# Patient Record
Sex: Male | Born: 1935 | Race: Black or African American | Hispanic: No | Smoking: Never smoker
Health system: Southern US, Community
[De-identification: ages and names within clinical notes are randomized; demographics above are authoritative.]

## PROBLEM LIST (undated history)

## (undated) DIAGNOSIS — N390 Urinary tract infection, site not specified: Secondary | ICD-10-CM

## (undated) DIAGNOSIS — M1990 Unspecified osteoarthritis, unspecified site: Secondary | ICD-10-CM

## (undated) DIAGNOSIS — I10 Essential (primary) hypertension: Secondary | ICD-10-CM

## (undated) DIAGNOSIS — I2510 Atherosclerotic heart disease of native coronary artery without angina pectoris: Secondary | ICD-10-CM

## (undated) DIAGNOSIS — J40 Bronchitis, not specified as acute or chronic: Secondary | ICD-10-CM

## (undated) DIAGNOSIS — J189 Pneumonia, unspecified organism: Secondary | ICD-10-CM

## (undated) HISTORY — PX: HERNIA REPAIR: SHX51

## (undated) HISTORY — PX: CORONARY STENT PLACEMENT: SHX1402

---

## 1997-07-26 ENCOUNTER — Ambulatory Visit (HOSPITAL_COMMUNITY): Admission: RE | Admit: 1997-07-26 | Discharge: 1997-07-26 | Payer: Self-pay | Admitting: Nephrology

## 1999-02-13 ENCOUNTER — Encounter: Admission: RE | Admit: 1999-02-13 | Discharge: 1999-02-13 | Payer: Self-pay | Admitting: Nephrology

## 1999-02-13 ENCOUNTER — Encounter: Payer: Self-pay | Admitting: Nephrology

## 1999-02-18 ENCOUNTER — Encounter: Payer: Self-pay | Admitting: Urology

## 1999-02-18 ENCOUNTER — Encounter: Admission: RE | Admit: 1999-02-18 | Discharge: 1999-02-18 | Payer: Self-pay | Admitting: Urology

## 1999-02-23 ENCOUNTER — Encounter: Payer: Self-pay | Admitting: Urology

## 1999-02-23 ENCOUNTER — Encounter: Admission: RE | Admit: 1999-02-23 | Discharge: 1999-02-23 | Payer: Self-pay | Admitting: Urology

## 2001-12-21 ENCOUNTER — Encounter: Admission: RE | Admit: 2001-12-21 | Discharge: 2002-03-21 | Payer: Self-pay | Admitting: Internal Medicine

## 2004-05-06 ENCOUNTER — Emergency Department (HOSPITAL_COMMUNITY): Admission: EM | Admit: 2004-05-06 | Discharge: 2004-05-06 | Payer: Self-pay | Admitting: Emergency Medicine

## 2004-05-26 ENCOUNTER — Ambulatory Visit (HOSPITAL_COMMUNITY): Admission: RE | Admit: 2004-05-26 | Discharge: 2004-05-26 | Payer: Self-pay | Admitting: Gastroenterology

## 2004-05-26 ENCOUNTER — Encounter (INDEPENDENT_AMBULATORY_CARE_PROVIDER_SITE_OTHER): Payer: Self-pay | Admitting: Specialist

## 2005-07-19 ENCOUNTER — Encounter: Admission: RE | Admit: 2005-07-19 | Discharge: 2005-07-19 | Payer: Self-pay | Admitting: Internal Medicine

## 2008-03-05 ENCOUNTER — Emergency Department (HOSPITAL_COMMUNITY): Admission: EM | Admit: 2008-03-05 | Discharge: 2008-03-06 | Payer: Self-pay | Admitting: Emergency Medicine

## 2008-07-17 ENCOUNTER — Encounter: Admission: RE | Admit: 2008-07-17 | Discharge: 2008-07-17 | Payer: Self-pay | Admitting: Family Medicine

## 2008-07-22 ENCOUNTER — Encounter: Admission: RE | Admit: 2008-07-22 | Discharge: 2008-07-22 | Payer: Self-pay | Admitting: Family Medicine

## 2008-08-06 ENCOUNTER — Inpatient Hospital Stay (HOSPITAL_COMMUNITY): Admission: AD | Admit: 2008-08-06 | Discharge: 2008-08-08 | Payer: Self-pay | Admitting: Cardiology

## 2009-07-09 ENCOUNTER — Inpatient Hospital Stay (HOSPITAL_COMMUNITY): Admission: EM | Admit: 2009-07-09 | Discharge: 2009-07-11 | Payer: Self-pay | Admitting: Emergency Medicine

## 2010-06-15 ENCOUNTER — Inpatient Hospital Stay (INDEPENDENT_AMBULATORY_CARE_PROVIDER_SITE_OTHER)
Admission: RE | Admit: 2010-06-15 | Discharge: 2010-06-15 | Disposition: A | Discharge status: Home / Self Care | Payer: Medicare Other | Source: Ambulatory Visit | Attending: Family Medicine | Admitting: Family Medicine

## 2010-06-15 DIAGNOSIS — M129 Arthropathy, unspecified: Secondary | ICD-10-CM

## 2010-07-12 LAB — DIFFERENTIAL
Basophils Absolute: 0 10*3/uL (ref 0.0–0.1)
Basophils Relative: 0 % (ref 0–1)
Lymphocytes Relative: 22 % (ref 12–46)
Lymphs Abs: 0.9 10*3/uL (ref 0.7–4.0)
Monocytes Relative: 12 % (ref 3–12)
Neutro Abs: 2.6 10*3/uL (ref 1.7–7.7)
Neutrophils Relative %: 63 % (ref 43–77)

## 2010-07-12 LAB — CBC
HCT: 27.8 % — ABNORMAL LOW (ref 39.0–52.0)
MCHC: 32.8 g/dL (ref 30.0–36.0)
MCV: 86.8 fL (ref 78.0–100.0)
MCV: 87.2 fL (ref 78.0–100.0)
Platelets: 183 10*3/uL (ref 150–400)
RBC: 3.77 MIL/uL — ABNORMAL LOW (ref 4.22–5.81)
WBC: 4.9 10*3/uL (ref 4.0–10.5)

## 2010-07-12 LAB — TYPE AND SCREEN
Antibody Screen: POSITIVE
DAT, IgG: NEGATIVE
Donor AG Type: NEGATIVE

## 2010-07-12 LAB — HEMOGLOBIN AND HEMATOCRIT, BLOOD
HCT: 28.7 % — ABNORMAL LOW (ref 39.0–52.0)
HCT: 30.6 % — ABNORMAL LOW (ref 39.0–52.0)
HCT: 35.3 % — ABNORMAL LOW (ref 39.0–52.0)
Hemoglobin: 11.5 g/dL — ABNORMAL LOW (ref 13.0–17.0)
Hemoglobin: 9.6 g/dL — ABNORMAL LOW (ref 13.0–17.0)

## 2010-07-12 LAB — URINALYSIS, ROUTINE W REFLEX MICROSCOPIC
Glucose, UA: NEGATIVE mg/dL
Hgb urine dipstick: NEGATIVE
Leukocytes, UA: NEGATIVE
Specific Gravity, Urine: 1.025 (ref 1.005–1.030)

## 2010-07-12 LAB — HEMOCCULT GUIAC POC 1CARD (OFFICE): Fecal Occult Bld: POSITIVE

## 2010-07-12 LAB — COMPREHENSIVE METABOLIC PANEL
ALT: 15 U/L (ref 0–53)
Albumin: 3.4 g/dL — ABNORMAL LOW (ref 3.5–5.2)
Alkaline Phosphatase: 73 U/L (ref 39–117)
BUN: 15 mg/dL (ref 6–23)
GFR calc non Af Amer: 42 mL/min — ABNORMAL LOW (ref >60)
Potassium: 3.8 mEq/L (ref 3.5–5.1)
Total Protein: 7.2 g/dL (ref 6.0–8.3)

## 2010-07-12 LAB — BASIC METABOLIC PANEL
BUN: 15 mg/dL (ref 6–23)
Chloride: 106 mEq/L (ref 96–112)
Potassium: 3.8 mEq/L (ref 3.5–5.1)

## 2010-07-12 LAB — URINE CULTURE

## 2010-07-12 LAB — POCT CARDIAC MARKERS
CKMB, poc: 1.8 ng/mL (ref 1.0–8.0)
Myoglobin, poc: 177 ng/mL (ref 12–200)
Troponin i, poc: 0.11 ng/mL — ABNORMAL HIGH (ref 0.00–0.09)

## 2010-07-12 LAB — CK TOTAL AND CKMB (NOT AT ARMC)
CK, MB: 2.6 ng/mL (ref 0.3–4.0)
Relative Index: 1.5 (ref 0.0–2.5)
Total CK: 177 U/L (ref 7–232)

## 2010-07-12 LAB — CARDIAC PANEL(CRET KIN+CKTOT+MB+TROPI)
CK, MB: 2.8 ng/mL (ref 0.3–4.0)
Troponin I: 0.02 ng/mL (ref 0.00–0.06)

## 2010-07-12 LAB — TROPONIN I: Troponin I: 0.02 ng/mL (ref 0.00–0.06)

## 2010-07-12 LAB — URINE MICROSCOPIC-ADD ON

## 2010-07-29 LAB — CBC
HCT: 27.6 % — ABNORMAL LOW (ref 39.0–52.0)
Hemoglobin: 9.3 g/dL — ABNORMAL LOW (ref 13.0–17.0)
MCHC: 33.8 g/dL (ref 30.0–36.0)
MCHC: 34.4 g/dL (ref 30.0–36.0)
MCV: 84.8 fL (ref 78.0–100.0)
RBC: 3.25 MIL/uL — ABNORMAL LOW (ref 4.22–5.81)
RDW: 16.3 % — ABNORMAL HIGH (ref 11.5–15.5)

## 2010-07-29 LAB — BASIC METABOLIC PANEL
CO2: 27 mEq/L (ref 19–32)
CO2: 27 mEq/L (ref 19–32)
Calcium: 9 mg/dL (ref 8.4–10.5)
Chloride: 104 mEq/L (ref 96–112)
Creatinine, Ser: 1.53 mg/dL — ABNORMAL HIGH (ref 0.4–1.5)
GFR calc Af Amer: 46 mL/min — ABNORMAL LOW (ref >60)
Glucose, Bld: 104 mg/dL — ABNORMAL HIGH (ref 70–99)
Sodium: 138 mEq/L (ref 135–145)

## 2010-09-01 NOTE — Cardiovascular Report (Signed)
NAME:  Luke Sanchez, DESHLER NO.:  [id number]   MEDICAL RECORD NO.:  [id number]          PATIENT TYPE:  INP   LOCATION:  2501                         FACILITY:  [facility]   PHYSICIAN:  Eduardo Osier. Sharyn Lull, M.D. DATE OF BIRTH:  Apr 26, 1914   DATE OF PROCEDURE:  08/06/2008  DATE OF DISCHARGE:                            CARDIAC CATHETERIZATION   PROCEDURES:  1. Left cardiac catheterization with selective left and right coronary      angiography, left ventriculography via right groin using Judkins      technique.  2. Successful percutaneous transluminal coronary angioplasty to      proximal right coronary artery using 2.5 x 12 mm long Voyager      balloon.  3. Successful deployment of 3.0 x 23 mm long Vision Multi-Link stent      in proximal and proximal and mid junction of right coronary artery.  4. Successful postdilatation of the stent using 3.25 x 15 mm long [place]      Voyager balloon.   INDICATIONS FOR PROCEDURE:  Mr. Luke Sanchez is a 77 year old black male with  past medical history significant for hypertension, degenerative joint  disease, CA of prostate, hypercholesteremia, complains of precordial  chest pain described as heaviness off and on for last 1 month lasting 3  minutes and relieved with rest.  Denies any nausea, vomiting, or  diaphoresis.  Denies shortness of breath.  Denies palpitation,  lightheadedness, or syncope.  Denies any weakness in the arms or legs.  Denies any slurred speech.  Also, complains of claudication pain off and  on while walking.  Denies any cardiac workup in the past.  Due to  typical anginal chest pain and multiple risk factors discussed with the  patient regarding left cath, possible PTCA stenting, its risks and  benefits, i.e., death, MI, stroke, need for emergency CABG, risk of  restenosis, local vascular complications, etc., and consented for the  procedure.   PROCEDURE:  After obtaining the informed consent, the patient was  brought to the  cath lab and was placed on fluoroscopy table.  The right  groin was prepped and draped in usual fashion.  Xylocaine 2% was used  for local anesthesia in the right groin.  With the help of thin-wall  needle, 6-French arterial sheath was placed.  The sheath was aspirated  and flushed.  Next, 6-French left Judkins catheter was advanced over the  wire under fluoroscopic guidance up to the ascending aorta.  Wire was  pulled out.  The catheter was aspirated and connected to the manifold.  Catheter was further advanced and engaged into the left coronary ostium.  Multiple views of the left system were taken.  Next, the catheter was  disengaged and was pulled out over the wire and was replaced with 6-  [place] right Judkins catheter, which was advanced over the wire under  fluoroscopic guidance up to the ascending aorta.  The wire was pulled  out.  The catheter was aspirated and connected to the manifold.  Catheter was further advanced and engaged into right coronary ostium.  Multiple views of the right system were  taken.  Next, the catheter was  disengaged and was pulled out over the wire and was replaced with 6-  French pigtail catheter, which was advanced over the wire under  fluoroscopic guidance up to the ascending aorta.  Wire was pulled out.  The catheter was aspirated and connected to the manifold.  Catheter was  further advanced across aortic valve into the LV.  LV pressures were  recorded.  Next, LV graphy was done in 30-degree RAO position.  Post-  angiographic pressures were recorded from LV and then pullback pressures  were recorded from the aorta.  There was no gradient across the aortic  valve.  Next, pigtail catheter was pulled out over the wire.  Sheaths  were aspirated and flushed.   FINDINGS:  LV showed good LV systolic function, EF of 55-60%.  Left main  was short, which was patent.  LAD has 10-20% proximal stenosis.  Diagonal 1 and 2 were small, which were patent.  Ramus was  moderate  size, which was patent.  Left circumflex was patent in proximal and  midportion and has 70-75% distal smooth stenosis, distally the vessel is  small.  OM1 to OM3 were small, which were patent.  RCA has 80-85%  proximal and 60% sequential mid stenosis distally.  RCA is 100% occluded  beyond PDA.  Distal RCA was filling by collaterals from the left system.  Of note, PDA was no filling by collaterals from the left system.   INTERVENTIONAL PROCEDURE:  Successful PTCA to proximal RCA was done  using 3.5 x 12 mm long Voyager balloon for predilatation and then 3.0 x  23 mm long Vision Multi-Link venous stent was deployed in proximal and  proximal and mid junction of RCA at 13 atmospheric pressure.  Stent was  postdilated using 3.25 x 15 mm long Voyager balloon going up to 18  atmospheric pressure without evidence of dissection or distal  embolization with TIMI grade 3 distal flow in PDA.  Initially, attempted  to cross distal chronically totally total occlusion of RCA briefly, but  the wire could not be advanced, the lesion appeared to be chronically  occluded, and the patient also had collaterals from the left system,  which filled the distal RCA.  The patient tolerated procedure well.  The  patient received weight-based Angiomax and 300 mg of Plavix during the  procedure.  The patient tolerated the procedure well and was transferred  to recovery room in stable condition.      Eduardo Osier. Sharyn Lull, M.D.  Electronically Signed     MNH/MEDQ  D:  08/06/2008  T:  08/07/2008  Job:  [id number]   cc:   Cath Lab

## 2010-09-01 NOTE — Discharge Summary (Signed)
NAME:  Luke Sanchez, Luke Sanchez NO.:  [id number]   MEDICAL RECORD NO.:  [id number]          PATIENT TYPE:  INP   LOCATION:  2501                         FACILITY:  [facility]   PHYSICIAN:  Eduardo Osier. Sharyn Lull, M.D. DATE OF BIRTH:  1915/01/27   DATE OF ADMISSION:  08/06/2008  DATE OF DISCHARGE:  08/08/2008                               DISCHARGE SUMMARY   ADMITTING DIAGNOSES:  1. New-onset angina, rule out coronary insufficiency.  2. Mild aortic stenosis/sclerosis.  3. Hypertension.  4. Hypercholesteremia.  5. Degenerative joint disease.   DISCHARGE DIAGNOSES:  1. New onset angina status post percutaneous transluminal coronary      angioplasty stenting to right coronary artery.  2. Hypertension.  3. Hypercholesteremia.  4. Degenerative joint disease.  5. Aortic sclerosis.  6. Chronic anemia.   DISCHARGE HOME MEDICATIONS:  1. Enteric-coated aspirin 81 mg 1 tablet daily.  2. Plavix 75 mg 1 tablet daily with food.  3. Toprol-XL 50 mg 1 tablet daily.  4. Norvasc 5 mg 1 tablet daily.  5. Enalapril 5 mg 1 tablet daily.  6. Nitro-Dur patch 0.2 mg per hour, apply to chest wall in a.m. off at      night.  7. Kapidex 60 mg 1 daily.  8. Rapaflo 8 mg 1 daily as before.  9. Avodart 0.25 mg daily.  10.Crestor 10 mg 1 tablet daily.  11.Nitrostat 0.4 mg sublingual use as directed.  12.Tylenol 650 mg 1 tablet every 6-8 hours for musculoskeletal neck      pain as needed.  13.Feosol 325 mg 1 tablet daily.   DIET:  Low-salt, low-cholesterol.   ACTIVITY:  Increase activity slowly with assistance.  No lifting,  driving, sexual activity for 1 week.  Post PTCA and stent instructions  have been given.  Follow up with me in 1 week.   CONDITION ON DISCHARGE:  Stable.   BRIEF HISTORY AND HOSPITAL COURSE:  Mr. Heintzelman is a 77 year old black  male with past medical history significant for hypertension,  degenerative joint disease, CA of prostate, hypercholesteremia complains  of precordial  chest pain described as heaviness off and on for last 1  month lasting few minutes and relieves with rest.  Denies any nausea,  vomiting, or diaphoresis.  Denies shortness of breath.  Denies  palpitation, lightheadedness, or syncope.  Denies any weakness in arms  or legs.  Denies slurred speech.  He also complains of claudication pain  off and on with walking.  Denies any cardiac workup in the past.   PAST MEDICAL HISTORY:  As above.   PAST SURGICAL HISTORY:  He had bilateral hernia repair.   ALLERGIES:  No known drug allergies.   MEDICATIONS AT HOME:  1. Metoprolol succinate 100 mg daily.  2. Norvasc 5 mg p.o. daily.  3. Rapaflo 8 mg p.o. daily.  4. Avodart 0.5 mg p.o. daily.  5. Enalapril 5 mg p.o. daily.  6. Aspirin 81 mg p.o. daily.  7. Kapidex 60 mg p.o. daily.  8. Nitro-Dur 0.2 mg per hour daily.   SOCIAL HISTORY:  He is married, 6 children, retired, and worked  for  cigarette factory in the past.  No history of smoking or alcohol abuse.   FAMILY HISTORY:  Noncontributory.   PHYSICAL EXAMINATION:  GENERAL:  He is alert, awake, and oriented x3 in  no acute distress.  VITAL SIGNS:  Blood pressure was 130/70, pulse was 49 and regular.  HEENT:  Conjunctivae were pink.  NECK:  Supple.  No JVD.  No bruit.  LUNGS:  Clear to auscultation without rhonchi or rales.  CARDIOVASCULAR:  S1-S2 was normal.  There was 2/6 systolic ejection  murmur.  ABDOMEN:  Soft.  Bowel sounds were present and nontender.  EXTREMITIES:  There is no clubbing, cyanosis, or edema.   IMPRESSION:  1. New onset angina, rule out coronary insufficiency.  2. Aortic stenosis/sclerosis.  3. Hypertension.  4. Hypercholesteremia.  5. Anemia.  6. Degenerative joint disease.   BRIEF HOSPITAL COURSE:  The patient was a.m. admit and underwent left  cardiac cath with selective left and right coronary angioplasty and PTCA  stenting to RCA as per procedure report.  The patient tolerated the  procedure well.  There  were no complications.  Postprocedure, the  patient did not have any episodes of chest pain.  His groin remained  stable with no evidence of hematoma or bruit.  Phase I cardiac rehab was  called.  The patient has been ambulating without any problems except  some musculoskeletal neck pain.  The patient's hemoglobin yesterday  dropped from 11 to 9.3 with hematocrit of 37.6.  Repeat hemoglobin today  is 10.8 with hematocrit of 31.3.  The  patient's renal function is stable.  The patient did not have any  further chest pain during the hospital stay.  His groin was stable.  The  patient will be discharged home on above medications and will be  followed up in my office in 1 week.      Eduardo Osier. Sharyn Lull, M.D.  Electronically Signed     MNH/MEDQ  D:  08/08/2008  T:  08/08/2008  Job:  [id number]

## 2010-09-02 ENCOUNTER — Emergency Department (HOSPITAL_COMMUNITY): Payer: Medicare Other

## 2010-09-02 ENCOUNTER — Encounter (HOSPITAL_COMMUNITY): Payer: Self-pay | Admitting: Radiology

## 2010-09-02 ENCOUNTER — Emergency Department (HOSPITAL_COMMUNITY)
Admission: EM | Admit: 2010-09-02 | Discharge: 2010-09-02 | Disposition: A | Discharge status: Home / Self Care | Payer: Medicare Other | Attending: Emergency Medicine | Admitting: Emergency Medicine

## 2010-09-02 ENCOUNTER — Inpatient Hospital Stay (INDEPENDENT_AMBULATORY_CARE_PROVIDER_SITE_OTHER)
Admission: RE | Admit: 2010-09-02 | Discharge: 2010-09-02 | Disposition: A | Discharge status: Home / Self Care | Payer: Medicare Other | Source: Ambulatory Visit | Attending: Family Medicine | Admitting: Family Medicine

## 2010-09-02 DIAGNOSIS — W19XXXA Unspecified fall, initial encounter: Secondary | ICD-10-CM

## 2010-09-02 DIAGNOSIS — M25519 Pain in unspecified shoulder: Secondary | ICD-10-CM | POA: Insufficient documentation

## 2010-09-02 DIAGNOSIS — R220 Localized swelling, mass and lump, head: Secondary | ICD-10-CM | POA: Insufficient documentation

## 2010-09-02 DIAGNOSIS — R040 Epistaxis: Secondary | ICD-10-CM

## 2010-09-02 DIAGNOSIS — M25539 Pain in unspecified wrist: Secondary | ICD-10-CM | POA: Insufficient documentation

## 2010-09-02 DIAGNOSIS — M129 Arthropathy, unspecified: Secondary | ICD-10-CM | POA: Insufficient documentation

## 2010-09-02 DIAGNOSIS — IMO0002 Reserved for concepts with insufficient information to code with codable children: Secondary | ICD-10-CM | POA: Insufficient documentation

## 2010-09-02 DIAGNOSIS — S0003XA Contusion of scalp, initial encounter: Secondary | ICD-10-CM | POA: Insufficient documentation

## 2010-09-02 DIAGNOSIS — M546 Pain in thoracic spine: Secondary | ICD-10-CM | POA: Insufficient documentation

## 2010-09-02 DIAGNOSIS — I10 Essential (primary) hypertension: Secondary | ICD-10-CM | POA: Insufficient documentation

## 2010-09-02 DIAGNOSIS — S0083XA Contusion of other part of head, initial encounter: Secondary | ICD-10-CM | POA: Insufficient documentation

## 2010-09-02 DIAGNOSIS — W010XXA Fall on same level from slipping, tripping and stumbling without subsequent striking against object, initial encounter: Secondary | ICD-10-CM | POA: Insufficient documentation

## 2010-09-02 DIAGNOSIS — Z79899 Other long term (current) drug therapy: Secondary | ICD-10-CM | POA: Insufficient documentation

## 2010-09-02 DIAGNOSIS — R51 Headache: Secondary | ICD-10-CM | POA: Insufficient documentation

## 2010-09-02 DIAGNOSIS — E7800 Pure hypercholesterolemia, unspecified: Secondary | ICD-10-CM | POA: Insufficient documentation

## 2010-09-02 HISTORY — DX: Essential (primary) hypertension: I10

## 2010-09-09 ENCOUNTER — Other Ambulatory Visit: Payer: Self-pay | Admitting: Family Medicine

## 2010-09-09 DIAGNOSIS — R410 Disorientation, unspecified: Secondary | ICD-10-CM

## 2010-09-10 ENCOUNTER — Other Ambulatory Visit: Payer: Medicare Other

## 2011-01-19 LAB — URINALYSIS, ROUTINE W REFLEX MICROSCOPIC
Ketones, ur: NEGATIVE
Leukocytes, UA: NEGATIVE
Protein, ur: NEGATIVE
Urobilinogen, UA: 0.2

## 2011-01-19 LAB — POCT I-STAT, CHEM 8
BUN: 22
Chloride: 102
Creatinine, Ser: 1.8 — ABNORMAL HIGH
Sodium: 135
TCO2: 24

## 2011-01-19 LAB — URINE MICROSCOPIC-ADD ON

## 2011-03-03 ENCOUNTER — Emergency Department (HOSPITAL_COMMUNITY): Payer: Medicare Other

## 2011-03-03 ENCOUNTER — Encounter (HOSPITAL_COMMUNITY): Payer: Self-pay | Admitting: *Deleted

## 2011-03-03 ENCOUNTER — Inpatient Hospital Stay (HOSPITAL_COMMUNITY)
Admission: EM | Admit: 2011-03-03 | Discharge: 2011-03-08 | DRG: 378 | Disposition: A | Discharge status: Home / Self Care | Payer: Medicare Other | Attending: Family Medicine | Admitting: Family Medicine

## 2011-03-03 DIAGNOSIS — Z8701 Personal history of pneumonia (recurrent): Secondary | ICD-10-CM

## 2011-03-03 DIAGNOSIS — M129 Arthropathy, unspecified: Secondary | ICD-10-CM | POA: Diagnosis present

## 2011-03-03 DIAGNOSIS — Z880 Allergy status to penicillin: Secondary | ICD-10-CM

## 2011-03-03 DIAGNOSIS — K922 Gastrointestinal hemorrhage, unspecified: Secondary | ICD-10-CM

## 2011-03-03 DIAGNOSIS — I359 Nonrheumatic aortic valve disorder, unspecified: Secondary | ICD-10-CM | POA: Diagnosis present

## 2011-03-03 DIAGNOSIS — Z79899 Other long term (current) drug therapy: Secondary | ICD-10-CM

## 2011-03-03 DIAGNOSIS — K5792 Diverticulitis of intestine, part unspecified, without perforation or abscess without bleeding: Secondary | ICD-10-CM

## 2011-03-03 DIAGNOSIS — Z9861 Coronary angioplasty status: Secondary | ICD-10-CM

## 2011-03-03 DIAGNOSIS — D649 Anemia, unspecified: Secondary | ICD-10-CM

## 2011-03-03 DIAGNOSIS — I350 Nonrheumatic aortic (valve) stenosis: Secondary | ICD-10-CM

## 2011-03-03 DIAGNOSIS — R1032 Left lower quadrant pain: Secondary | ICD-10-CM

## 2011-03-03 DIAGNOSIS — K5733 Diverticulitis of large intestine without perforation or abscess with bleeding: Secondary | ICD-10-CM

## 2011-03-03 DIAGNOSIS — I2510 Atherosclerotic heart disease of native coronary artery without angina pectoris: Secondary | ICD-10-CM

## 2011-03-03 DIAGNOSIS — I10 Essential (primary) hypertension: Secondary | ICD-10-CM | POA: Diagnosis present

## 2011-03-03 DIAGNOSIS — N400 Enlarged prostate without lower urinary tract symptoms: Secondary | ICD-10-CM | POA: Diagnosis present

## 2011-03-03 DIAGNOSIS — D62 Acute posthemorrhagic anemia: Secondary | ICD-10-CM

## 2011-03-03 HISTORY — DX: Pneumonia, unspecified organism: J18.9

## 2011-03-03 HISTORY — DX: Unspecified osteoarthritis, unspecified site: M19.90

## 2011-03-03 HISTORY — DX: Bronchitis, not specified as acute or chronic: J40

## 2011-03-03 HISTORY — DX: Atherosclerotic heart disease of native coronary artery without angina pectoris: I25.10

## 2011-03-03 LAB — DIFFERENTIAL
Basophils Absolute: 0 10*3/uL (ref 0.0–0.1)
Eosinophils Relative: 0 % (ref 0–5)
Lymphocytes Relative: 18 % (ref 12–46)
Monocytes Relative: 12 % (ref 3–12)
Neutrophils Relative %: 70 % (ref 43–77)

## 2011-03-03 LAB — CBC
MCV: 89.4 fL (ref 78.0–100.0)
Platelets: 151 10*3/uL (ref 150–400)
RBC: 2.35 MIL/uL — ABNORMAL LOW (ref 4.22–5.81)
RDW: 15.9 % — ABNORMAL HIGH (ref 11.5–15.5)
WBC: 5.8 10*3/uL (ref 4.0–10.5)

## 2011-03-03 LAB — BASIC METABOLIC PANEL
CO2: 31 mEq/L (ref 19–32)
Chloride: 100 mEq/L (ref 96–112)
Creatinine, Ser: 1.49 mg/dL — ABNORMAL HIGH (ref 0.50–1.35)
GFR calc Af Amer: 44 mL/min — ABNORMAL LOW (ref >90)
Potassium: 4 mEq/L (ref 3.5–5.1)
Sodium: 139 mEq/L (ref 135–145)

## 2011-03-03 LAB — PREPARE RBC (CROSSMATCH)

## 2011-03-03 MED ORDER — PANTOPRAZOLE SODIUM 40 MG IV SOLR
INTRAVENOUS | Status: AC
  Filled 2011-03-03: qty 40

## 2011-03-03 MED ORDER — MORPHINE SULFATE 4 MG/ML IJ SOLN
4.0000 mg | Freq: Once | INTRAMUSCULAR | Status: AC
  Administered 2011-03-03: 4 mg via INTRAVENOUS

## 2011-03-03 MED ORDER — CIPROFLOXACIN IN D5W 400 MG/200ML IV SOLN
400.0000 mg | Freq: Two times a day (BID) | INTRAVENOUS | Status: DC
  Administered 2011-03-04 – 2011-03-07 (×7): 400 mg via INTRAVENOUS
  Filled 2011-03-03 (×10): qty 200

## 2011-03-03 MED ORDER — ONDANSETRON HCL 4 MG/2ML IJ SOLN
4.0000 mg | Freq: Once | INTRAMUSCULAR | Status: AC
  Administered 2011-03-03: 4 mg via INTRAVENOUS

## 2011-03-03 MED ORDER — MORPHINE SULFATE 2 MG/ML IJ SOLN
1.0000 mg | INTRAMUSCULAR | Status: DC | PRN
  Administered 2011-03-03 – 2011-03-04 (×2): 1 mg via INTRAVENOUS
  Filled 2011-03-03 (×2): qty 1

## 2011-03-03 MED ORDER — MORPHINE SULFATE 4 MG/ML IJ SOLN
INTRAMUSCULAR | Status: AC
  Filled 2011-03-03: qty 1

## 2011-03-03 MED ORDER — ONDANSETRON HCL 4 MG/2ML IJ SOLN: 4.0000 mg | Freq: Four times a day (QID) | INTRAMUSCULAR | Status: DC | PRN

## 2011-03-03 MED ORDER — METRONIDAZOLE IN NACL 5-0.79 MG/ML-% IV SOLN
500.0000 mg | Freq: Once | INTRAVENOUS | Status: AC
  Administered 2011-03-03: 500 mg via INTRAVENOUS
  Filled 2011-03-03: qty 100

## 2011-03-03 MED ORDER — CIPROFLOXACIN IN D5W 400 MG/200ML IV SOLN
400.0000 mg | Freq: Once | INTRAVENOUS | Status: AC
  Administered 2011-03-03: 400 mg via INTRAVENOUS
  Filled 2011-03-03: qty 200

## 2011-03-03 MED ORDER — METRONIDAZOLE IN NACL 5-0.79 MG/ML-% IV SOLN
500.0000 mg | Freq: Three times a day (TID) | INTRAVENOUS | Status: DC
  Administered 2011-03-04 – 2011-03-07 (×11): 500 mg via INTRAVENOUS
  Filled 2011-03-03 (×15): qty 100

## 2011-03-03 MED ORDER — PANTOPRAZOLE SODIUM 40 MG IV SOLR
40.0000 mg | Freq: Once | INTRAVENOUS | Status: AC
  Administered 2011-03-03: 40 mg via INTRAVENOUS

## 2011-03-03 MED ORDER — ONDANSETRON HCL 4 MG/2ML IJ SOLN
INTRAMUSCULAR | Status: AC
  Filled 2011-03-03: qty 2

## 2011-03-03 MED ORDER — ONDANSETRON HCL 4 MG PO TABS: 4.0000 mg | ORAL_TABLET | Freq: Four times a day (QID) | ORAL | Status: DC | PRN

## 2011-03-03 MED ORDER — IOHEXOL 300 MG/ML  SOLN
80.0000 mL | Freq: Once | INTRAMUSCULAR | Status: AC | PRN
  Administered 2011-03-03: 80 mL via INTRAVENOUS

## 2011-03-03 MED ORDER — SODIUM CHLORIDE 0.9 % IV SOLN
INTRAVENOUS | Status: DC
  Administered 2011-03-03: 1000 mL via INTRAVENOUS

## 2011-03-03 MED ORDER — SODIUM CHLORIDE 0.9 % IV SOLN: INTRAVENOUS | Status: AC

## 2011-03-03 NOTE — ED Notes (Signed)
Pts daughter reports pt passed dark stools this am. Clots present. Pt reports abd pain this am, none currently. Hx of diverticulosis.

## 2011-03-03 NOTE — ED Notes (Signed)
Informed Dr. Nino Parsley of abnormal lab of hgb 6.7

## 2011-03-03 NOTE — ED Notes (Signed)
MD at bedside. 

## 2011-03-03 NOTE — ED Notes (Signed)
Pt doing okay. Just got back to bed from bathroom where he had bloody stool. Introduced myself to patient. Awaiting blood from blood bank. And Md to reassess

## 2011-03-03 NOTE — ED Notes (Signed)
Called to restroom due to patient bleeding. Passing large blood clots. Dr Nino Parsley aware. Assisted back to his room. No complaints at present.

## 2011-03-03 NOTE — ED Notes (Signed)
Pt and daughter states that bloody stools started this am. States that he had diverticulitis in March which he had bloody stools. Escorted pt to restroom and he did have bright red liquid bloody stools

## 2011-03-03 NOTE — H&P (Signed)
PCP:   dr hill   Chief Complaint:  Luke Sanchez, abd pain  HPI: 77 yo highly functioning male lives at home with his wife of 28 years marriage comes in with dtr due to several days of worsening llq pain and then started this am with brbpr.  He has also had several episodes of vomiting and not eating well.  He has had diverticular bleed before earlier this year and this will be his 2nd hospitalization this year for same issue.  He is still have some bleeding red per rectum but not as much.  Denies fevers at home.  Mentation has been normal.  Review of Systems:  O/w neg  Past Medical History: Past Medical History  Diagnosis Date  . Hypertension    Past Surgical History  Procedure Date  . Coronary stent placement     Medications: Prior to Admission medications   Medication Sig Start Date End Date Taking? Authorizing Provider  ARTIFICIAL TEAR OP Apply 1 drop to eye daily as needed. For dry eyes    Yes Historical Provider, MD  Dexlansoprazole (DEXILANT) 30 MG capsule Take 60 mg by mouth daily.    Yes Historical Provider, MD  ferrous sulfate 325 (65 FE) MG tablet Take 325 mg by mouth daily with breakfast.     Yes Historical Provider, MD  finasteride (PROSCAR) 5 MG tablet Take 5 mg by mouth daily.    Yes Historical Provider, MD  metoprolol (TOPROL-XL) 50 MG 24 hr tablet Take 50 mg by mouth daily.     Yes Historical Provider, MD  olmesartan (BENICAR) 40 MG tablet Take 20 mg by mouth daily.    Yes Historical Provider, MD  silodosin (RAPAFLO) 4 MG CAPS capsule Take 8 mg by mouth daily with breakfast.    Yes Historical Provider, MD    Allergies:   Allergies  Allergen Reactions  . Penicillins Anaphylaxis    Social History:  reports that he has never smoked. He does not have any smokeless tobacco history on file. He reports that he does not drink alcohol or use illicit drugs.  Family History: No family history on file.  Physical Exam: Filed Vitals:   03/03/11 1518 03/03/11 1923  BP:  159/48 136/52  Pulse: 63 65  Temp:  98.7 F (37.1 C)  TempSrc:  Oral  Resp: 16 16  SpO2: 99% 95%   General appearance: alert, cooperative and no distress Resp: clear to auscultation bilaterally Cardio: regular rate and rhythm, S1, S2 normal, no murmur, click, rub or gallop GI: abnormal findings:  soft nd, ttp llq pos bs no r/g nonacute abd Extremities: extremities normal, atraumatic, no cyanosis or edema Pulses: 2+ and symmetric Skin: Skin color, texture, turgor normal. No rashes or lesions Neurologic: Grossly normal   Labs on Admission:   [facility] 03/03/11 1745  NA 139  K 4.0  CL 100  CO2 31  GLUCOSE 124*  BUN 26*  CREATININE 1.49*  CALCIUM 8.7  MG --  PHOS --    Basename 03/03/11 1745  WBC 5.8  NEUTROABS 4.1  HGB 6.7*  HCT 21.0*  MCV 89.4  PLT 151    Radiological Exams on Admission: Ct Abdomen Pelvis W Contrast  03/03/2011  *RADIOLOGY REPORT*  Clinical Data: Left lower quadrant pain.  History diverticulitis.  CT ABDOMEN AND PELVIS WITH CONTRAST  Technique:  Multidetector CT imaging of the abdomen and pelvis was performed following the standard protocol during bolus administration of intravenous contrast.  Contrast:  75 ml Omnipaque 300  Comparison: CT abdomen and pelvis 07/09/2009.  Findings: The heart size is normal.  Extensive coronary artery calcifications are again noted.  No significant pleural or pericardial effusions are present.  Mild intra and extrahepatic biliary dilation is present.  The common bile duct measures up to 9.5 mm.  This may be within normal limits for age.  The gallbladder is normal.  Minimal calcifications within the left lobe of the liver are stable.  The stomach, duodenum, and pancreas are within normal limits.  Spleen is unremarkable.  Cystic changes of both kidneys are stable.  Atherosclerotic calcifications are present in the abdominal aorta without aneurysm. There is borderline aneurysmal dilation of the iliac arteries.  Extensive  diverticular changes are noted within the sigmoid colon. There is focal inflammation at the junction of the descending and sigmoid colon, compatible with diverticulitis.  No significant free fluid or free air is evident.  The more proximal colon demonstrates diverticular change without other signs inflammation.  The appendix is within normal limits.  Small bowel is unremarkable.  The urinary bladder and prostate gland are within normal limits for age.  No significant adenopathy or free fluid is present.  Degenerative anterolisthesis at L3-4 is stable.  Multilevel spondylosis is otherwise unchanged.  No focal lytic or blastic lesions are evident.  IMPRESSION:  1.  Sigmoid diverticulitis without evidence for abscess or free air. 2.  Extensive diverticular changes distal and proximal to the side of diverticulitis without a second area of acute inflammation. 3.  Atherosclerosis with borderline aneurysmal dilation of the proximal iliac arteries.  Coronary artery disease is evident. 4.  Mild intra and extrahepatic biliary dilation may be within normal limits for age. 5.  Stable spondylosis of the lumbar spine.  Original Report Authenticated By: Jamesetta Orleans. MATTERN, M.D.    Assessment/Plan Present on Admission:  77 yo male with acute diverticulitis with acute blood loss anemia due to same  .Aortic valve stenosis  stable .CAD S/P percutaneous coronary angioplasty hold anticoagulants .GIB (gastrointestinal bleeding)  Transfuse 2 units prbc, serial h/h q 6hr .Abdominal pain, left lower quadrant .Acute blood loss anemia .Diverticulitis of colon with bleeding  Iv cipro/flagyl.  No absess on ct scan.  Place in stepdown overnight at least pt HD stable but due to advanced age high risk for deterioration. Should respond to conservative treatment.  DAVID,RACHAL A 03/03/2011, 8:50 PM

## 2011-03-03 NOTE — ED Provider Notes (Addendum)
History     CSN: [id number] Arrival date & time: 03/03/2011  3:14 PM   First MD Initiated Contact with Patient 03/03/11 1643      Chief Complaint  Patient presents with  . Rectal Bleeding    (Consider location/radiation/quality/duration/timing/severity/associated sxs/prior treatment) Patient is a 77 y.o. male presenting with hematochezia. The history is provided by the patient and a relative.  Rectal Bleeding  Associated symptoms include abdominal pain, diarrhea, nausea and vomiting. Pertinent negatives include no fever, no hematuria, no chest pain, no headaches and no coughing.   the patient is a 77 year old male, with a history of diverticulosis, who presents to the emergency department with left lower abdominal pain, and bloody stool.  He has had diarrhea.  He had nausea and vomiting.  Last night.  He denies fevers, chills, shortness of breath, lightheadedness, or dizziness.  He is not taking any anticoagulants.  His daughter called his gastroenterologist, who told him to come to the emergency department for further evaluation.  Past Medical History  Diagnosis Date  . Hypertension     Past Surgical History  Procedure Date  . Coronary stent placement     No family history on file.  History  Substance Use Topics  . Smoking status: Never Smoker   . Smokeless tobacco: Not on file  . Alcohol Use: No      Review of Systems  Constitutional: Negative for fever, chills and diaphoresis.  HENT: Negative for nosebleeds and congestion.   Eyes: Negative for redness.  Respiratory: Negative for cough, chest tightness and shortness of breath.   Cardiovascular: Negative for chest pain and palpitations.  Gastrointestinal: Positive for nausea, vomiting, abdominal pain, diarrhea, blood in stool and hematochezia.  Genitourinary: Negative for hematuria.  Musculoskeletal: Negative for back pain.  Neurological: Negative for headaches.  Psychiatric/Behavioral: Negative for confusion.     Allergies  Penicillins  Home Medications   Current Outpatient Rx  Name Route Sig Dispense Refill  . ARTIFICIAL TEAR OP Ophthalmic Apply 1 drop to eye daily as needed. For dry eyes     . DEXLANSOPRAZOLE 30 MG PO CPDR Oral Take 60 mg by mouth daily.     Marland Kitchen FERROUS SULFATE 325 (65 FE) MG PO TABS Oral Take 325 mg by mouth daily with breakfast.      . FINASTERIDE 5 MG PO TABS Oral Take 5 mg by mouth daily.     Marland Kitchen METOPROLOL SUCCINATE 50 MG PO TB24 Oral Take 50 mg by mouth daily.      Marland Kitchen OLMESARTAN MEDOXOMIL 40 MG PO TABS Oral Take 20 mg by mouth daily.     Marland Kitchen SILODOSIN 4 MG PO CAPS Oral Take 8 mg by mouth daily with breakfast.       BP 159/48  Pulse 63  Resp 16  SpO2 99%  Physical Exam  Constitutional: He is oriented to person, place, and time. He appears well-developed and well-nourished. No distress.  HENT:  Head: Normocephalic and atraumatic.  Eyes: EOM are normal. Pupils are equal, round, and reactive to light.  Neck: Normal range of motion. Neck supple.  Cardiovascular: Normal rate and regular rhythm.   Murmur heard. Pulmonary/Chest: Effort normal and breath sounds normal. No respiratory distress. He has no wheezes. He has no rales.  Abdominal: Soft. Bowel sounds are normal. He exhibits no distension and no mass. There is tenderness. There is no rebound and no guarding.       Moderate tenderness in left lower quadrant and left upper quadrant  Musculoskeletal: Normal range of motion. He exhibits no edema and no tenderness.  Neurological: He is alert and oriented to person, place, and time. No cranial nerve deficit.  Skin: Skin is warm and dry. He is not diaphoretic.  Psychiatric: He has a normal mood and affect. His behavior is normal.    ED Course  Procedures (including critical care time)  77 year old male, with a history of diverticulosis presents with abdominal pain, and bloody stool.  He got tenderness in the left lower quadrant.  On examination.  We will establish an  IV perform laboratory testing, and a CAT scan of his abdomen and treat his symptoms.   Labs Reviewed  CBC  DIFFERENTIAL  BASIC METABOLIC PANEL   Explained findings to pt and daughter.  Dr. Mirna Mires is pcp.  Dr. Randa Evens is GI MD.  8:26 PM Spoke with Dr. Onalee Hua.  She will admit to step down.  Team 2  CRITICAL CARE Performed by: Nicholes Stairs   Total critical care time: 30 min  Critical care time was exclusive of separately billable procedures and treating other patients.  Critical care was necessary to treat or prevent imminent or life-threatening deterioration.  Critical care was time spent personally by me on the following activities: development of treatment plan with patient and/or surrogate as well as nursing, discussions with consultants, evaluation of patient's response to treatment, examination of patient, obtaining history from patient or surrogate, ordering and performing treatments and interventions, ordering and review of laboratory studies, ordering and review of radiographic studies, pulse oximetry and re-evaluation of patient's condition.   MDM  Diverticulitis- requiring iv abxs GI bleed causing anemia which required transfusion Hemodynamically stable.  No hypotension or tachycardia        Nicholes Stairs, MD 03/03/11 2008  Nicholes Stairs, MD 03/03/11 2028

## 2011-03-03 NOTE — ED Notes (Signed)
Patient has ambulated to restroom x3 with assistance results bright red liquid stools

## 2011-03-04 ENCOUNTER — Encounter (HOSPITAL_COMMUNITY): Payer: Self-pay | Admitting: Family Medicine

## 2011-03-04 LAB — CBC
Hemoglobin: 7.7 g/dL — ABNORMAL LOW (ref 13.0–17.0)
MCHC: 32.1 g/dL (ref 30.0–36.0)
WBC: 4.2 10*3/uL (ref 4.0–10.5)

## 2011-03-04 LAB — COMPREHENSIVE METABOLIC PANEL
ALT: 7 U/L (ref 0–53)
BUN: 24 mg/dL — ABNORMAL HIGH (ref 6–23)
CO2: 30 mEq/L (ref 19–32)
Calcium: 8.2 mg/dL — ABNORMAL LOW (ref 8.4–10.5)
Creatinine, Ser: 1.52 mg/dL — ABNORMAL HIGH (ref 0.50–1.35)
GFR calc Af Amer: 43 mL/min — ABNORMAL LOW (ref >90)
GFR calc non Af Amer: 37 mL/min — ABNORMAL LOW (ref >90)
Glucose, Bld: 92 mg/dL (ref 70–99)
Sodium: 137 mEq/L (ref 135–145)

## 2011-03-04 LAB — PROTIME-INR
INR: 1.27 (ref 0.00–1.49)
Prothrombin Time: 16.2 seconds — ABNORMAL HIGH (ref 11.6–15.2)

## 2011-03-04 LAB — HEMOGLOBIN AND HEMATOCRIT, BLOOD: Hemoglobin: 7.1 g/dL — ABNORMAL LOW (ref 13.0–17.0)

## 2011-03-04 LAB — APTT: aPTT: 40 seconds — ABNORMAL HIGH (ref 24–37)

## 2011-03-04 MED ORDER — ARTIFICIAL TEARS OP OINT
TOPICAL_OINTMENT | OPHTHALMIC | Status: DC | PRN
  Administered 2011-03-04: 19:00:00 via OPHTHALMIC
  Filled 2011-03-04: qty 3.5

## 2011-03-04 MED ORDER — DIPHENHYDRAMINE HCL 50 MG/ML IJ SOLN
6.2500 mg | Freq: Once | INTRAMUSCULAR | Status: AC
  Administered 2011-03-04: 6.5 mg via INTRAVENOUS
  Filled 2011-03-04: qty 1

## 2011-03-04 MED ORDER — FUROSEMIDE 10 MG/ML IJ SOLN: 20.0000 mg | Freq: Once | INTRAMUSCULAR | Status: DC

## 2011-03-04 MED ORDER — ACETAMINOPHEN 325 MG PO TABS
650.0000 mg | ORAL_TABLET | Freq: Once | ORAL | Status: AC
  Administered 2011-03-04: 650 mg via ORAL
  Filled 2011-03-04: qty 2

## 2011-03-04 NOTE — Progress Notes (Signed)
UR completed 

## 2011-03-04 NOTE — ED Notes (Signed)
Pt family member wishes to be contacted when pt gets a room. Contact name and number: [employer] [phone] (pt daughter). Call anytime with room number,contact nurse  And phone number.

## 2011-03-04 NOTE — ED Notes (Signed)
Second unit infusing. Pt tolerating well

## 2011-03-04 NOTE — Progress Notes (Signed)
[facility]ITAL[facility] progress note    Subjective: Complete history and physical reviewed with patient at bedside.  Patient states that he had about 7 episodes of dark stool mixed with blood. He states the blood was bright red at times and dark red and started yesterday. He states that the stool started first and then started having cramping abdominal pain in his left lower quadrant. It was sort of a "twisting" pain. He states that he has no abdominal pain currently.  He has no nausea no vomiting no chest pain no blurred vision no double vision. He does not get dizzy when he stands.     Objective: Vital signs in last 24 hours: Temp:  [97.7 F (36.5 C)-98.8 F (37.1 C)] 98.6 F (37 C) (11/15 0507) Pulse Rate:  [58-104] 86  (11/15 0507) Resp:  [16-22] 20  (11/15 0507) BP: (129-175)/(48-86) 175/75 mmHg (11/15 0507) SpO2:  [93 %-99 %] 93 % (11/15 0507) Weight:  [63.3 kg (139 lb 8.8 oz)] 139 lb 8.8 oz (63.3 kg) (11/15 0507) Weight change:   Intake/Output Summary (Last 24 hours) at 03/04/11 0811 Last data filed at 03/04/11 0600  Gross per 24 hour  Intake    709 ml  Output    400 ml  Net    309 ml    General appearance: alert, cooperative, appears stated age and no distress Head: Normocephalic, without obvious abnormality, atraumatic Neck: no adenopathy, no carotid bruit, no JVD, supple, symmetrical, trachea midline and thyroid not enlarged, symmetric, no tenderness/mass/nodules Back: symmetric, no curvature. ROM normal. No CVA tenderness. Lungs: clear to auscultation bilaterally and normal percussion bilaterally Abdomen: abnormal findings:  guarding and moderate tenderness in the LLQ Neurologic: Alert and oriented X 3, normal strength and tone. Normal symmetric reflexes. Normal coordination and gait  Lab Results:  [facility] 03/04/11 0405 03/03/11 1745  NA 137 139  K 4.1 4.0  CL 101 100  CO2 30 31  GLUCOSE 92 124*  BUN 24* 26*  CREATININE 1.52* 1.49*  CALCIUM 8.2* 8.7    MG -- --  PHOS -- --    Basename 03/04/11 0405  AST 17  ALT 7  ALKPHOS 52  BILITOT 0.7  PROT 5.8*  ALBUMIN 2.7*   No results found for this basename: LIPASE:2,AMYLASE:2 in the last 72 hours  Basename 03/04/11 0405 03/03/11 1745  WBC 4.2 5.8  NEUTROABS -- 4.1  HGB 7.7* 6.7*  HCT 24.0* 21.0*  MCV 90.2 89.4  PLT 116* 151   No results found for this basename: CKTOTAL:3,CKMB:3,CKMBINDEX:3,TROPONINI:3 in the last 72 hours  Micro Results: Recent Results (from the past 240 hour(s))  MRSA PCR SCREENING     Status: Normal   Collection Time   03/04/11  4:33 AM      Component Value Range Status Comment   MRSA by PCR NEGATIVE  NEGATIVE  Final    Studies/Results: Ct Abdomen Pelvis W Contrast  03/03/2011  *RADIOLOGY REPORT*  Clinical Data: Left lower quadrant pain.  History diverticulitis.  CT ABDOMEN AND PELVIS WITH CONTRAST  Technique:  Multidetector CT imaging of the abdomen and pelvis was performed following the standard protocol during bolus administration of intravenous contrast.  Contrast:  75 ml Omnipaque 300  Comparison: CT abdomen and pelvis 07/09/2009.  Findings: The heart size is normal.  Extensive coronary artery calcifications are again noted.  No significant pleural or pericardial effusions are present.  Mild intra and extrahepatic biliary dilation is present.  The common bile duct measures up to 9.5 mm.  This may be within normal limits for age.  The gallbladder is normal.  Minimal calcifications within the left lobe of the liver are stable.  The stomach, duodenum, and pancreas are within normal limits.  Spleen is unremarkable.  Cystic changes of both kidneys are stable.  Atherosclerotic calcifications are present in the abdominal aorta without aneurysm. There is borderline aneurysmal dilation of the iliac arteries.  Extensive diverticular changes are noted within the sigmoid colon. There is focal inflammation at the junction of the descending and sigmoid colon, compatible with  diverticulitis.  No significant free fluid or free air is evident.  The more proximal colon demonstrates diverticular change without other signs inflammation.  The appendix is within normal limits.  Small bowel is unremarkable.  The urinary bladder and prostate gland are within normal limits for age.  No significant adenopathy or free fluid is present.  Degenerative anterolisthesis at L3-4 is stable.  Multilevel spondylosis is otherwise unchanged.  No focal lytic or blastic lesions are evident.  IMPRESSION:  1.  Sigmoid diverticulitis without evidence for abscess or free air. 2.  Extensive diverticular changes distal and proximal to the side of diverticulitis without a second area of acute inflammation. 3.  Atherosclerosis with borderline aneurysmal dilation of the proximal iliac arteries.  Coronary artery disease is evident. 4.  Mild intra and extrahepatic biliary dilation may be within normal limits for age. 5.  Stable spondylosis of the lumbar spine.  Original Report Authenticated By: Jamesetta Orleans. MATTERN, M.D.   Medications: I have reviewed the patient's current medications. Scheduled Meds:   . ciprofloxacin  400 mg Intravenous Once  . ciprofloxacin  400 mg Intravenous Q12H  . metronidazole  500 mg Intravenous Once  . metronidazole  500 mg Intravenous Q8H  .  morphine injection  4 mg Intravenous Once  . ondansetron  4 mg Intravenous Once  . pantoprazole (PROTONIX) IV  40 mg Intravenous Once   Continuous Infusions:   . sodium chloride    . DISCONTD: sodium chloride 1,000 mL (03/03/11 1744)   PRN Meds:.iohexol, morphine, ondansetron (ZOFRAN) IV, ondansetron Medications Discontinued During This Encounter  Medication Reason  . metoprolol (LOPRESSOR) 50 MG tablet Duplicate  . 0.9 %  sodium chloride infusion    Assessment/Plan: Patient Active Hospital Problem List: GIB (gastrointestinal bleeding) (03/03/2011)   Assessment:    Plan: Patient's GI bleed is likely secondary to diverticulitis.  Patient hasn't followed by Dr. Carman Ching gastroenterology in the past. Patient has a history of stent placement in 2010 as well but obviously his bleeding currently will preclude the use of any type of antiplatelet therapy. CBC a.m. D2 ciprofloxacin and Flagyl/ duration 14 days Bowel rest-n.p.o. today versus graduate clear liquids this afternoon  Aortic valve stenosis (03/03/2011)   Assessment: Stable. This is considered mild per cardiac catheterization done in 2010.   CAD S/P percutaneous coronary angioplasty (03/03/2011)   Assessment: Stable. Patient is to be on Plavix as per above discussion. Outpatient discussion with cardiology and gastroenterology.     Abdominal pain, left lower quadrant (03/03/2011)   Assessment: Secondary to #1     Acute blood loss anemia (03/03/2011)   Assessment: Status post 2 units packed red blood cell transfusion. Will get another data point tomorrow to review.     Diverticulitis of colon with bleeding (03/03/2011)   Assessment: Stable. See above     History of prostatic cancer versus benign prostatic hypertrophy-stable   LOS: 1 day   Jovanni Rash,JAI 03/04/2011, 8:11 AM

## 2011-03-04 NOTE — Consult Note (Signed)
Angiographic data from 2010 cath FINDINGS:  LV showed good LV systolic function, EF of 55-60%.  Left main   was short, which was patent.  LAD has 10-20% proximal stenosis.   Diagonal 1 and 2 were small, which were patent.  Ramus was moderate   size, which was patent.  Left circumflex was patent in proximal and   midportion and has 70-75% distal smooth stenosis, distally the vessel is   small.  OM1 to OM3 were small, which were patent.  RCA has 80-85%   proximal and 60% sequential mid stenosis distally.  RCA is 100% occluded   beyond PDA.  Distal RCA was filling by collaterals from the left system.   Of note, PDA was no filling by collaterals from the left system.

## 2011-03-04 NOTE — ED Notes (Signed)
Pt asleep. Tolerated transfusion well. No issues at this time. Pt has room assignment. Will attempt to call report

## 2011-03-04 NOTE — ED Notes (Signed)
Report called to carrie, RN 2 west. All questions/concerns answered

## 2011-03-04 NOTE — ED Notes (Signed)
Phlebotomy in with pt drawing morning labs. Will transfer pt to 2 west after labs

## 2011-03-05 LAB — CBC
HCT: 25.4 % — ABNORMAL LOW (ref 39.0–52.0)
Hemoglobin: 8.6 g/dL — ABNORMAL LOW (ref 13.0–17.0)
RDW: 15.1 % (ref 11.5–15.5)
WBC: 4 10*3/uL (ref 4.0–10.5)

## 2011-03-05 LAB — HEMOGLOBIN AND HEMATOCRIT, BLOOD: Hemoglobin: 8.7 g/dL — ABNORMAL LOW (ref 13.0–17.0)

## 2011-03-05 MED ORDER — LABETALOL HCL 5 MG/ML IV SOLN
5.0000 mg | INTRAVENOUS | Status: DC | PRN
  Administered 2011-03-05 – 2011-03-06 (×5): 5 mg via INTRAVENOUS
  Filled 2011-03-05 (×3): qty 4

## 2011-03-05 NOTE — Progress Notes (Signed)
Physical Therapy Evaluation Patient Details Name: Luke Sanchez MRN: [id number] DOB: 06-21-1914 Today's Date: 03/05/2011 [id number] Darene Lamer  Problem List:  Patient Active Problem List  Diagnoses  . Aortic valve stenosis  . CAD S/P percutaneous coronary angioplasty  . GIB (gastrointestinal bleeding)  . Abdominal pain, left lower quadrant  . Acute blood loss anemia  . Diverticulitis of colon with bleeding    Past Medical History:  Past Medical History  Diagnosis Date  . Hypertension   . Bronchitis   . Arthritis   . Pneumonia     hx of  . CAD (coronary artery disease)     stent RCA 2010-[name]   Past Surgical History:  Past Surgical History  Procedure Date  . Coronary stent placement   . Hernia repair     x 2    PT Assessment/Plan/Recommendation PT Assessment Clinical Impression Statement: Patient incontinent of stool with GI bleed so placed mesh briefs and pad on patient.  Pt. admitted with GI bleed presents with decreased strength, balance, activity tolerance and will benefit from skilled PT in the acute setting to allow D/C home with wife and potentially out side help hired by family and HHPT. PT Recommendation/Assessment: Patient will need skilled PT in the acute care venue PT Problem List: Decreased knowledge of use of DME;Decreased activity tolerance;Decreased balance;Decreased mobility Barriers to Discharge: Decreased caregiver support PT Therapy Diagnosis : Abnormality of gait;Generalized weakness PT Plan PT Frequency: Min 3X/week PT Treatment/Interventions: Gait training;Patient/family education;DME instruction;Functional mobility training;Therapeutic exercise;Balance training PT Recommendation Follow Up Recommendations: Home health PT Equipment Recommended: Rolling walker with 5" wheels (states the one he has is too short) PT Goals  Acute Rehab PT Goals PT Goal Formulation: With patient Time For Goal Achievement: 7 days Pt will Transfer Sit to Stand/Stand  to Sit: with modified independence PT Transfer Goal: Sit to Stand/Stand to Sit - Progress: Progressing toward goal Pt will Stand: with modified independence;3 - 5 min;with unilateral upper extremity support (while performing simple functional task without LOB) PT Goal: Stand - Progress: Progressing toward goal Pt will Ambulate: >150 feet;with modified independence;with least restrictive assistive device PT Goal: Ambulate - Progress: Progressing toward goal  PT Evaluation Precautions/Restrictions    Prior Functioning  Home Living Lives With: Spouse Type of Home: House Home Layout: One level Home Access: Level entry Bathroom Shower/Tub: Engineer, manufacturing systems: Standard (with sink beside) Home Adaptive Equipment: Walker - rolling;Straight cane (walker too short per patient) Prior Function Level of Independence: Independent with gait;Independent with homemaking with ambulation;Independent with basic ADLs Cognition Cognition Arousal/Alertness: Awake/alert Overall Cognitive Status: Appears within functional limits for tasks assessed Sensation/Coordination Sensation Light Touch: Impaired Detail Light Touch Impaired Details: Impaired LLE;Impaired RLE Extremity Assessment RLE Assessment RLE Assessment: Within Functional Limits LLE Assessment LLE Assessment: Within Functional Limits Mobility (including Balance) Bed Mobility Rolling Right: 6: Modified independent (Device/Increase time);With rail Rolling Left: With rail;6: Modified independent (Device/Increase time) Supine to Sit: 6: Modified independent (Device/Increase time);HOB flat Sit to Supine - Right: 6: Modified independent (Device/Increase time);HOB flat Transfers Sit to Stand: 4: Min assist Sit to Stand Details (indicate cue type and reason): pulls up on rolling walker Stand to Sit: 4: Min assist Stand to Sit Details: for safety to ensure controlled descent Ambulation/Gait Ambulation/Gait Assistance: 5:  Supervision Ambulation/Gait Assistance Details (indicate cue type and reason): for safety (did complain of increased knee pain with ambulation ("10/10") but reports he's accustomed to it and not increased over usual amount of pain with ambulation Ambulation Distance (  Feet): 200 Feet Assistive device: Rolling walker  Posture/Postural Control Posture/Postural Control: No significant limitations Exercise    End of Session PT - End of Session Equipment Utilized During Treatment: Gait belt Activity Tolerance: Patient tolerated treatment well Patient left: in bed General Behavior During Session: [facility] for tasks performed Cognition: [facility] for tasks performed  [facility] 03/05/2011, 11:59 AM

## 2011-03-05 NOTE — Progress Notes (Signed)
[facility]ITAL[facility] progress note   Interval h/o:- 77 yo highly functioning male lives at home with his wife of 24 years marriage comes in with dtr due to several days of worsening llq pain and then started this am with brbpr. He has also had several episodes of vomiting and not eating well. He has had diverticular bleed before earlier this year and this will be his 2nd hospitalization this year for same issue He relates to me today that he has chronic arthritis and states that he does not take ibuprofen because he knows this is strong for his stomach but does take BC powders for arthritis pain. He states he's been taking this for long period time and only stopped taking it when he started to get "sick"  Hemoglobin on admission was 6.7 transfuse 2 units and hemoglobin to 7.7. Patient then dropped to 7.1 again and was transfused further 2 units.    Subjective: States that he feels well had 2 dark stools this morning.  Nursing clarifies that this is dark mixed with bright red blood. He states he feels better but he still has some left lower quadrant pain. He's been taking clear liquids today and doesn't really feel that 100. He has no chest pain no shortness of breath no blurred vision no double vision or any other issues.    Objective: Vital signs in last 24 hours: Temp:  [98.2 F (36.8 C)-100.1 F (37.8 C)] 98.2 F (36.8 C) (11/16 1341) Pulse Rate:  [63-78] 63  (11/16 1341) Resp:  [15-21] 18  (11/16 1341) BP: (156-210)/(56-96) 181/71 mmHg (11/16 1341) SpO2:  [96 %-98 %] 96 % (11/16 1341) Weight:  [64.547 kg (142 lb 4.8 oz)] 142 lb 4.8 oz (64.547 kg) (11/16 0045) Weight change: 1.247 kg (2 lb 12 oz)  Intake/Output Summary (Last 24 hours) at 03/05/11 1702 Last data filed at 03/05/11 1453  Gross per 24 hour  Intake 2227.5 ml  Output   1325 ml  Net  902.5 ml    General appearance: alert and cooperative Eyes: negative Throat: lips, mucosa, and tongue normal; teeth and gums  normal Lungs: clear to auscultation bilaterally and normal percussion bilaterally Heart: regular rate and rhythm, S1, S2 normal, no murmur, click, rub or gallop Abdomen: abnormal findings:  mild tenderness in the LLQ Extremities: extremities normal, atraumatic, no cyanosis or edema  Lab Results:  [facility] 03/04/11 0405 03/03/11 1745  NA 137 139  K 4.1 4.0  CL 101 100  CO2 30 31  GLUCOSE 92 124*  BUN 24* 26*  CREATININE 1.52* 1.49*  CALCIUM 8.2* 8.7  MG -- --  PHOS -- --    Basename 03/04/11 0405  AST 17  ALT 7  ALKPHOS 52  BILITOT 0.7  PROT 5.8*  ALBUMIN 2.7*   No results found for this basename: LIPASE:2,AMYLASE:2 in the last 72 hours  Basename 03/05/11 1616 03/05/11 0817 03/05/11 0320 03/04/11 0405 03/03/11 1745  WBC -- -- 4.0 4.2 --  NEUTROABS -- -- -- -- 4.1  HGB 9.2* 8.7* -- -- --  HCT 27.4* 25.6* -- -- --  MCV -- -- 87.9 90.2 --  PLT -- -- 114* 116* --   No results found for this basename: CKTOTAL:3,CKMB:3,CKMBINDEX:3,TROPONINI:3 in the last 72 hours No results found for this basename: POCBNP:3 in the last 72 hours No results found for this basename: DDIMER:2 in the last 72 hours No results found for this basename: HGBA1C:2 in the last 72 hours No results found for this basename:  CHOL:2,HDL:2,LDLCALC:2,TRIG:2,CHOLHDL:2,LDLDIRECT:2 in the last 72 hours No results found for this basename: TSH,T4TOTAL,FREET3,T3FREE,THYROIDAB in the last 72 hours No results found for this basename: VITAMINB12:2,FOLATE:2,FERRITIN:2,TIBC:2,IRON:2,RETICCTPCT:2 in the last 72 hours Micro Results: Recent Results (from the past 240 hour(s))  MRSA PCR SCREENING     Status: Normal   Collection Time   03/04/11  4:33 AM      Component Value Range Status Comment   MRSA by PCR NEGATIVE  NEGATIVE  Final    Studies/Results: Ct Abdomen Pelvis W Contrast  03/03/2011  *RADIOLOGY REPORT*  Clinical Data: Left lower quadrant pain.  History diverticulitis.  CT ABDOMEN AND PELVIS WITH CONTRAST   Technique:  Multidetector CT imaging of the abdomen and pelvis was performed following the standard protocol during bolus administration of intravenous contrast.  Contrast:  75 ml Omnipaque 300  Comparison: CT abdomen and pelvis 07/09/2009.  Findings: The heart size is normal.  Extensive coronary artery calcifications are again noted.  No significant pleural or pericardial effusions are present.  Mild intra and extrahepatic biliary dilation is present.  The common bile duct measures up to 9.5 mm.  This may be within normal limits for age.  The gallbladder is normal.  Minimal calcifications within the left lobe of the liver are stable.  The stomach, duodenum, and pancreas are within normal limits.  Spleen is unremarkable.  Cystic changes of both kidneys are stable.  Atherosclerotic calcifications are present in the abdominal aorta without aneurysm. There is borderline aneurysmal dilation of the iliac arteries.  Extensive diverticular changes are noted within the sigmoid colon. There is focal inflammation at the junction of the descending and sigmoid colon, compatible with diverticulitis.  No significant free fluid or free air is evident.  The more proximal colon demonstrates diverticular change without other signs inflammation.  The appendix is within normal limits.  Small bowel is unremarkable.  The urinary bladder and prostate gland are within normal limits for age.  No significant adenopathy or free fluid is present.  Degenerative anterolisthesis at L3-4 is stable.  Multilevel spondylosis is otherwise unchanged.  No focal lytic or blastic lesions are evident.  IMPRESSION:  1.  Sigmoid diverticulitis without evidence for abscess or free air. 2.  Extensive diverticular changes distal and proximal to the side of diverticulitis without a second area of acute inflammation. 3.  Atherosclerosis with borderline aneurysmal dilation of the proximal iliac arteries.  Coronary artery disease is evident. 4.  Mild intra and  extrahepatic biliary dilation may be within normal limits for age. 5.  Stable spondylosis of the lumbar spine.  Original Report Authenticated By: Jamesetta Orleans. MATTERN, M.D.   Medications: I have reviewed the patient's current medications. Scheduled Meds:   . acetaminophen  650 mg Oral Once  . ciprofloxacin  400 mg Intravenous Q12H  . diphenhydrAMINE  6.5 mg Intravenous Once  . furosemide  20 mg Intravenous Once  . metronidazole  500 mg Intravenous Q8H   Continuous Infusions:  PRN Meds:.artificial tears, labetalol, morphine, ondansetron (ZOFRAN) IV, ondansetron Medications Discontinued During This Encounter  Medication Reason  . metoprolol (LOPRESSOR) 50 MG tablet Duplicate  . 0.9 %  sodium chloride infusion    Assessment/Plan:  GIB (gastrointestinal bleeding) (03/03/2011) Assessment:  Plan: Patient's GI bleed is likely secondary to diverticulitis. Patient has been followed by Dr. Carman Ching gastroenterology in the past. Patient has a history of stent placement in 2010 as well but obviously his bleeding currently will preclude the use of any type of antiplatelet therapy.  CBC a.m.  D3 ciprofloxacin and Flagyl/ duration 14 days  Bowel rest-n.p.o. today versus graduate clear liquids this afternoon   Given history of drop in hematocrit over the past one to 2 days and also the fact that patient takes [facility] powders, I will check a blood count tomorrow and ask on-call gastroenterology M.D. for consult regarding possible upper GI component to his bleed.  Aortic valve stenosis (03/03/2011) Assessment: Stable. This is considered mild per cardiac catheterization done in 2010.   CAD S/P percutaneous coronary angioplasty (03/03/2011) Assessment: Stable. Patient is to be on Plavix as per above discussion. Patient may benefit from an upper endoscopy to determine if he has any erosions. If this is felt to be the case patient would probably benefit more from aspirin then from Plavix as he may have  another bleed.   Abdominal pain, left lower quadrant (03/03/2011) Assessment: Secondary to #1   Acute blood loss anemia (03/03/2011) Assessment: Status post 4 units packed red blood cell transfusion. Hemoglobin appears to be stabilized at 9 which is an appropriate raise. I will check a hemoglobin in the morning   Diverticulitis of colon with bleeding (03/03/2011) Assessment: Stable. See above  History of prostatic cancer versus benign prostatic hypertrophy-stable      LOS: 2 days   Aniaya Bacha,JAI 03/05/2011, 5:02 PM

## 2011-03-05 NOTE — Progress Notes (Signed)
Spoke with Nettie Elm, patients dtr. Per PT recommendations patient would benefit from HHPT. Provided her with list of HH agencies, chose [facility] Contacted Lindsey at [facility] to arrange. Will need orders for [facility] PT and RW. Will continue to follow for d/c needs.

## 2011-03-06 LAB — HEMOGLOBIN AND HEMATOCRIT, BLOOD
HCT: 29 % — ABNORMAL LOW (ref 39.0–52.0)
HCT: 30.9 % — ABNORMAL LOW (ref 39.0–52.0)
Hemoglobin: 9.8 g/dL — ABNORMAL LOW (ref 13.0–17.0)

## 2011-03-06 MED ORDER — POLYVINYL ALCOHOL 1.4 % OP SOLN
1.0000 [drp] | Freq: Two times a day (BID) | OPHTHALMIC | Status: DC
  Administered 2011-03-07 – 2011-03-08 (×3): 1 [drp] via OPHTHALMIC
  Filled 2011-03-06 (×2): qty 15

## 2011-03-06 MED ORDER — ARTIFICIAL TEAR OP GEL: 1.0000 [drp] | Freq: Two times a day (BID) | OPHTHALMIC | Status: DC

## 2011-03-06 MED ORDER — OLMESARTAN MEDOXOMIL 20 MG PO TABS
20.0000 mg | ORAL_TABLET | Freq: Every day | ORAL | Status: DC
  Administered 2011-03-06 – 2011-03-07 (×2): 20 mg via ORAL
  Filled 2011-03-06 (×5): qty 1

## 2011-03-06 MED ORDER — METOPROLOL SUCCINATE ER 50 MG PO TB24
50.0000 mg | ORAL_TABLET | Freq: Every day | ORAL | Status: DC
  Administered 2011-03-06: 50 mg via ORAL
  Filled 2011-03-06 (×3): qty 1

## 2011-03-06 MED ORDER — FINASTERIDE 5 MG PO TABS
5.0000 mg | ORAL_TABLET | Freq: Every day | ORAL | Status: DC
  Administered 2011-03-06 – 2011-03-08 (×3): 5 mg via ORAL
  Filled 2011-03-06 (×4): qty 1

## 2011-03-06 MED ORDER — FERROUS SULFATE 325 (65 FE) MG PO TABS
325.0000 mg | ORAL_TABLET | Freq: Every day | ORAL | Status: DC
  Administered 2011-03-07 – 2011-03-08 (×2): 325 mg via ORAL
  Filled 2011-03-06 (×2): qty 1

## 2011-03-06 MED ORDER — SILODOSIN 8 MG PO CAPS
8.0000 mg | ORAL_CAPSULE | Freq: Every day | ORAL | Status: DC
  Administered 2011-03-07 – 2011-03-08 (×2): 8 mg via ORAL
  Filled 2011-03-06 (×2): qty 1

## 2011-03-06 MED ORDER — LABETALOL HCL 5 MG/ML IV SOLN
5.0000 mg | INTRAVENOUS | Status: DC | PRN
  Administered 2011-03-07: 5 mg via INTRAVENOUS
  Filled 2011-03-06 (×3): qty 4

## 2011-03-06 NOTE — Consult Note (Signed)
[facility] Consult Note  Referring Provider: No ref. provider found Primary Care Physician:  No primary provider on file. Primary Gastroenterologist:  Dr.  Antony Contras Complaint: Rectal bleeding HPI: Luke Sanchez is an 77 y.o.b lack male.  Who presents with dark and bright red blood per rectum with minimal abdominal cramps and no hemodynamic instability. He had a similar presentation in March 2011 and underwent colonoscopy which showed pan diverticulosis. His hemoglobin was 7.2 and is 9.5 this morning after 2 unit blood transfusion. His BUN was 24th creatinine 1.5.  Past Medical History  Diagnosis Date  . Hypertension   . Bronchitis   . Arthritis   . Pneumonia     hx of  . CAD (coronary artery disease)     stent RCA 2010-[name]    Past Surgical History  Procedure Date  . Coronary stent placement   . Hernia repair     x 2    Medications Prior to Admission  Medication Dose Route Frequency Provider Last Rate Last Dose  . 0.9 %  sodium chloride infusion   Intravenous Continuous Rachal A David      . acetaminophen (TYLENOL) tablet 650 mg  650 mg Oral Once Pleas Koch, MD   650 mg at 03/04/11 1859  . artificial tears (LACRILUBE) ophthalmic ointment   Both Eyes Q4H PRN Pleas Koch, MD      . ciprofloxacin (CIPRO) IVPB 400 mg  400 mg Intravenous Once Nicholes Stairs, MD   400 mg at 03/03/11 2042  . ciprofloxacin (CIPRO) IVPB 400 mg  400 mg Intravenous Q12H Rachal A David   400 mg at 03/05/11 2301  . diphenhydrAMINE (BENADRYL) injection 6.5 mg  6.5 mg Intravenous Once Pleas Koch, MD   6.5 mg at 03/04/11 1859  . furosemide (LASIX) injection 20 mg  20 mg Intravenous Once Pleas Koch, MD      . iohexol (OMNIPAQUE) 300 MG/ML injection 80 mL  80 mL Intravenous Once PRN Medication Radiologist   80 mL at 03/03/11 1949  . labetalol (NORMODYNE,TRANDATE) injection 5 mg  5 mg Intravenous Q10 min PRN Jonny Ruiz, MD   5 mg at 03/06/11 0646  . metroNIDAZOLE (FLAGYL) IVPB 500 mg   500 mg Intravenous Once Nicholes Stairs, MD   500 mg at 03/03/11 2041  . metroNIDAZOLE (FLAGYL) IVPB 500 mg  500 mg Intravenous Q8H Rachal A David   500 mg at 03/06/11 0528  . morphine 2 MG/ML injection 1 mg  1 mg Intravenous Q2H PRN Rachal A David   1 mg at 03/04/11 1610  . morphine 4 MG/ML injection 4 mg  4 mg Intravenous Once Nicholes Stairs, MD   4 mg at 03/03/11 1746  . ondansetron (ZOFRAN) injection 4 mg  4 mg Intravenous Once Nicholes Stairs, MD   4 mg at 03/03/11 1746  . ondansetron (ZOFRAN) tablet 4 mg  4 mg Oral Q6H PRN Rachal A David       Or  . ondansetron (ZOFRAN) injection 4 mg  4 mg Intravenous Q6H PRN Rachal A David      . pantoprazole (PROTONIX) injection 40 mg  40 mg Intravenous Once Nicholes Stairs, MD   40 mg at 03/03/11 1746  . DISCONTD: 0.9 %  sodium chloride infusion   Intravenous Continuous Nicholes Stairs, MD 125 mL/hr at 03/03/11 1744 1,000 mL at 03/03/11 1744   No current outpatient prescriptions on file as of 03/06/2011.    Allergies:  Allergies  Allergen Reactions  . Penicillins Anaphylaxis    History reviewed. No pertinent family history.  Social History:  reports that he has never smoked. He has quit using smokeless tobacco. His smokeless tobacco use included Snuff and Chew. He reports that he does not drink alcohol or use illicit drugs.  Negative except as outlined above   Blood pressure 180/76, pulse 75, temperature 98.2 F (36.8 C), temperature source Oral, resp. rate 18, height 5\' 8"  (1.727 m), weight 64.547 kg (142 lb 4.8 oz), SpO2 99.00%. Head: Normocephalic, without obvious abnormality, atraumatic Neck: no adenopathy, no carotid bruit, no JVD, supple, symmetrical, trachea midline and thyroid not enlarged, symmetric, no tenderness/mass/nodules Resp: clear to auscultation bilaterally Cardio: regular rate and rhythm, S1, S2 normal, no murmur, click, rub or gallop GI: Abdomen soft nondistended with normoactive bowel sounds no  hepatomegaly masses or guarding. Extremities: extremities normal, atraumatic, no cyanosis or edema  Results for orders placed during the hospital encounter of 03/03/11 (from the past 48 hour(s))  HEMOGLOBIN AND HEMATOCRIT, BLOOD     Status: Abnormal   Collection Time   03/04/11  1:15 PM      Component Value Range Comment   Hemoglobin 7.1 (*) 13.0 - 17.0 (g/dL)    HCT 16.1 (*) 09.6 - 52.0 (%)   PREPARE RBC (CROSSMATCH)     Status: Normal   Collection Time   03/04/11  3:30 PM      Component Value Range Comment   Order Confirmation ORDER PROCESSED BY BLOOD BANK     CBC     Status: Abnormal   Collection Time   03/05/11  3:20 AM      Component Value Range Comment   WBC 4.0  4.0 - 10.5 (K/uL)    RBC 2.89 (*) 4.22 - 5.81 (MIL/uL)    Hemoglobin 8.6 (*) 13.0 - 17.0 (g/dL)    HCT 04.5 (*) 40.9 - 52.0 (%)    MCV 87.9  78.0 - 100.0 (fL)    MCH 29.8  26.0 - 34.0 (pg)    MCHC 33.9  30.0 - 36.0 (g/dL)    RDW 81.1  91.4 - 78.2 (%)    Platelets 114 (*) 150 - 400 (K/uL) CONSISTENT WITH PREVIOUS RESULT  HEMOGLOBIN AND HEMATOCRIT, BLOOD     Status: Abnormal   Collection Time   03/05/11  8:17 AM      Component Value Range Comment   Hemoglobin 8.7 (*) 13.0 - 17.0 (g/dL)    HCT 95.6 (*) 21.3 - 52.0 (%)   HEMOGLOBIN AND HEMATOCRIT, BLOOD     Status: Abnormal   Collection Time   03/05/11  4:16 PM      Component Value Range Comment   Hemoglobin 9.2 (*) 13.0 - 17.0 (g/dL)    HCT 08.6 (*) 57.8 - 52.0 (%)   HEMOGLOBIN AND HEMATOCRIT, BLOOD     Status: Abnormal   Collection Time   03/05/11 11:50 PM      Component Value Range Comment   Hemoglobin 9.0 (*) 13.0 - 17.0 (g/dL)    HCT 46.9 (*) 62.9 - 52.0 (%)   HEMOGLOBIN AND HEMATOCRIT, BLOOD     Status: Abnormal   Collection Time   03/06/11  8:20 AM      Component Value Range Comment   Hemoglobin 9.5 (*) 13.0 - 17.0 (g/dL)    HCT 52.8 (*) 41.3 - 52.0 (%)    No results found.  Assessment: Lower GI bleed likely secondary to diverticulosis Plan:    Expectant  management alone transfuse as necessary. We'll follow with you. Okay to advance diet. Michelle Vanhise C 03/06/2011, 9:09 AM

## 2011-03-06 NOTE — Progress Notes (Signed)
[facility] progress note   Interval h/o:- 77 yo highly functioning male lives at home with his wife of 26 years marriage comes in with dtr due to several days of worsening llq pain and then started this am with brbpr. He has also had several episodes of vomiting and not eating well. He has had diverticular bleed before earlier this year and this will be his 2nd hospitalization this year for same issue--on her Silvey reports at bedside that he just finished course of oral antibiotics for diverticulitis at Dr. Evette Georges;' request 02/27/11.   He relates to me today that he has chronic arthritis and states that he does not take ibuprofen because he knows this is strong for his stomach but does take BC powders for arthritis pain. He states he's been taking this for long period time and only stopped taking it when he started to get "sick"   Hemoglobin on admission was 6.7 transfuse 2 units and hemoglobin to 7.7. Patient then dropped to 7.1 again and was transfused further 2 units.  He has no further issues today is in fact feeling pretty good  Subjective: Feels well daughter in room wishes to eat. No nausea no vomiting 3 stools today, none were bloody. Stain no shortness of breath no blurred vision no double vision Treatment Team:  Barrie Folk, MD Objective: Vital signs in last 24 hours: Temp:  [97.9 F (36.6 C)-99.5 F (37.5 C)] 97.9 F (36.6 C) (11/17 1400) Pulse Rate:  [60-75] 65  (11/17 1400) Resp:  [18] 18  (11/17 1400) BP: (157-220)/(68-99) 206/83 mmHg (11/17 1500) SpO2:  [98 %-99 %] 98 % (11/17 1400) Weight change:   Intake/Output Summary (Last 24 hours) at 03/06/11 1607 Last data filed at 03/06/11 1300  Gross per 24 hour  Intake      0 ml  Output    500 ml  Net   -500 ml    General appearance: alert and cooperative  Eyes: negative  Throat: lips, mucosa, and tongue normal; teeth and gums normal  Lungs: clear to auscultation bilaterally and normal percussion  bilaterally  Heart: regular rate and rhythm, S1, S2 normal, no murmur, click, rub or gallop  Abdomen: abnormal findings: mild tenderness in the LLQ  Extremities: extremities normal, atraumatic, no cyanosis or edema   Lab Results:  [facility] 03/04/11 0405 03/03/11 1745  NA 137 139  K 4.1 4.0  CL 101 100  CO2 30 31  GLUCOSE 92 124*  BUN 24* 26*  CREATININE 1.52* 1.49*  CALCIUM 8.2* 8.7  MG -- --  PHOS -- --    Basename 03/04/11 0405  AST 17  ALT 7  ALKPHOS 52  BILITOT 0.7  PROT 5.8*  ALBUMIN 2.7*   No results found for this basename: LIPASE:2,AMYLASE:2 in the last 72 hours  Basename 03/06/11 0820 03/05/11 2350 03/05/11 0320 03/04/11 0405 03/03/11 1745  WBC -- -- 4.0 4.2 --  NEUTROABS -- -- -- -- 4.1  HGB 9.5* 9.0* -- -- --  HCT 29.0* 26.6* -- -- --  MCV -- -- 87.9 90.2 --  PLT -- -- 114* 116* --   Micro Results: Recent Results (from the past 240 hour(s))  MRSA PCR SCREENING     Status: Normal   Collection Time   03/04/11  4:33 AM      Component Value Range Status Comment   MRSA by PCR NEGATIVE  NEGATIVE  Final    Studies/Results: Ct Abdomen Pelvis W Contrast  03/03/2011  *RADIOLOGY  REPORT*  Clinical Data: Left lower quadrant pain.  History diverticulitis.  CT ABDOMEN AND PELVIS WITH CONTRAST  Technique:  Multidetector CT imaging of the abdomen and pelvis was performed following the standard protocol during bolus administration of intravenous contrast.  Contrast:  75 ml Omnipaque 300  Comparison: CT abdomen and pelvis 07/09/2009.  Findings: The heart size is normal.  Extensive coronary artery calcifications are again noted.  No significant pleural or pericardial effusions are present.  Mild intra and extrahepatic biliary dilation is present.  The common bile duct measures up to 9.5 mm.  This may be within normal limits for age.  The gallbladder is normal.  Minimal calcifications within the left lobe of the liver are stable.  The stomach, duodenum, and pancreas are within  normal limits.  Spleen is unremarkable.  Cystic changes of both kidneys are stable.  Atherosclerotic calcifications are present in the abdominal aorta without aneurysm. There is borderline aneurysmal dilation of the iliac arteries.  Extensive diverticular changes are noted within the sigmoid colon. There is focal inflammation at the junction of the descending and sigmoid colon, compatible with diverticulitis.  No significant free fluid or free air is evident.  The more proximal colon demonstrates diverticular change without other signs inflammation.  The appendix is within normal limits.  Small bowel is unremarkable.  The urinary bladder and prostate gland are within normal limits for age.  No significant adenopathy or free fluid is present.  Degenerative anterolisthesis at L3-4 is stable.  Multilevel spondylosis is otherwise unchanged.  No focal lytic or blastic lesions are evident.  IMPRESSION:  1.  Sigmoid diverticulitis without evidence for abscess or free air. 2.  Extensive diverticular changes distal and proximal to the side of diverticulitis without a second area of acute inflammation. 3.  Atherosclerosis with borderline aneurysmal dilation of the proximal iliac arteries.  Coronary artery disease is evident. 4.  Mild intra and extrahepatic biliary dilation may be within normal limits for age. 5.  Stable spondylosis of the lumbar spine.  Original Report Authenticated By: Jamesetta Orleans. MATTERN, M.D.   Medications: I have reviewed the patient's current medications. Scheduled Meds:   . ciprofloxacin  400 mg Intravenous Q12H  . ferrous sulfate  325 mg Oral Q breakfast  . finasteride  5 mg Oral Daily  . furosemide  20 mg Intravenous Once  . metoprolol  50 mg Oral Daily  . metronidazole  500 mg Intravenous Q8H  . olmesartan  20 mg Oral Daily  . polyvinyl alcohol  1 drop Both Eyes BID  . silodosin  8 mg Oral Q breakfast  . DISCONTD: Artificial Tear  1 drop Both Eyes q12n4p   Continuous Infusions:    PRN Meds:.artificial tears, labetalol, morphine, ondansetron (ZOFRAN) IV, ondansetron, DISCONTD: labetalol Medications Discontinued During This Encounter  Medication Reason  . metoprolol (LOPRESSOR) 50 MG tablet Duplicate  . 0.9 %  sodium chloride infusion   . labetalol (NORMODYNE,TRANDATE) injection 5 mg   . Artificial Tear GEL 1 drop Formulary change   Assessment/Plan: Accelerated hypertension:-2 and his home dosages of fullness are 10 and metoprolol. Patient is on when necessary labetalol IV  GIB (gastrointestinal bleeding) (03/03/2011) Assessment:  Plan: Patient's GI bleed is likely secondary to diverticulitis. Patient has been followed by Dr. Carman Ching gastroenterology in the past. Patient has a history of stent placement in 2010 as well but obviously his bleeding currently will preclude the use of any type of antiplatelet therapy.  CBC a.m.  D4 ciprofloxacin and Flagyl/  duration 14 days to 21 days Bowel rest-n.p.o. today versus graduate clear liquids this afternoon  Given history of drop in hematocrit over the past one to 2 days and also the fact that patient takes [facility] powders, I will check a blood count tomorrow and ask on-call gastroenterology M.D. for consult regarding possible upper GI component to his bleed.   Likely recurrence of bleeding from diverticula. And because of this I spoke to Dr. Derrell Lolling of general surgery. Patient does not meet any criteria at present time for this Gen. Surgery or for emergent diverticular surgery however she's become hemodynamically unstable or develops any fever peritonitis or other acute abdominal signs may benefit from this   Aortic valve stenosis (03/03/2011) Assessment: Stable. This is considered mild per cardiac catheterization done in 2010.   CAD S/P percutaneous coronary angioplasty (03/03/2011) Assessment: Stable. Patient is to be on Plavix as per above discussion. Patient may benefit from an upper endoscopy to determine if he has any  erosions. If this is felt to be the case patient would probably benefit more from aspirin then from Plavix as he may have another bleed. Will follow gastroenterology's recommendations regarding resuming any type of antiplatelet therapy  Abdominal pain, left lower quadrant (03/03/2011) Assessment: Secondary to #1   Acute blood loss anemia (03/03/2011) Assessment: Status post 4 units packed red blood cell transfusion. Hemoglobin appears to be stabilized at 9 which is an appropriate raise.   Diverticulitis of colon with bleeding (03/03/2011) Assessment: Stable. See above  History of    benign prostatic hypertrophy-this stable we will [Proscar History of prostatic cancer versus benign prostatic hypertrophy-stable       LOS: 3 days   Anahit Klumb,JAI 03/06/2011, 4:07 PM

## 2011-03-07 LAB — TYPE AND SCREEN
ABO/RH(D): A NEG
Antibody Screen: POSITIVE
Unit division: 0
Unit division: 0

## 2011-03-07 LAB — HEMOGLOBIN AND HEMATOCRIT, BLOOD
HCT: 30.2 % — ABNORMAL LOW (ref 39.0–52.0)
HCT: 28.9 % — ABNORMAL LOW (ref 39.0–52.0)
Hemoglobin: 10.3 g/dL — ABNORMAL LOW (ref 13.0–17.0)

## 2011-03-07 MED ORDER — OLMESARTAN MEDOXOMIL 40 MG PO TABS
40.0000 mg | ORAL_TABLET | Freq: Every day | ORAL | Status: DC
  Administered 2011-03-08: 40 mg via ORAL
  Filled 2011-03-07: qty 1

## 2011-03-07 MED ORDER — METOPROLOL SUCCINATE ER 50 MG PO TB24
50.0000 mg | ORAL_TABLET | Freq: Every day | ORAL | Status: DC
  Administered 2011-03-07 – 2011-03-08 (×2): 50 mg via ORAL
  Filled 2011-03-07 (×3): qty 1

## 2011-03-07 MED ORDER — METRONIDAZOLE 500 MG PO TABS
500.0000 mg | ORAL_TABLET | Freq: Three times a day (TID) | ORAL | Status: DC
  Administered 2011-03-07 – 2011-03-08 (×2): 500 mg via ORAL
  Filled 2011-03-07 (×5): qty 1

## 2011-03-07 MED ORDER — CIPROFLOXACIN HCL 500 MG PO TABS
500.0000 mg | ORAL_TABLET | Freq: Two times a day (BID) | ORAL | Status: DC
  Administered 2011-03-07 – 2011-03-08 (×2): 500 mg via ORAL
  Filled 2011-03-07 (×3): qty 1

## 2011-03-07 NOTE — Progress Notes (Signed)
Pt with no bowel movements today. He has ate well and states he feels much stronger, states he is  anxious  to go home. Very pleasant, IV therapist restarted PIV in L forearm. IV did infiltrate in R arm, RN placed heat pack to site, and site is WNL's.

## 2011-03-07 NOTE — Progress Notes (Signed)
[facility] progress note   Interval h/o:- 77 yo highly functioning male lives at home with his wife of 73 years marriage comes in with dtr due to several days of worsening llq pain and then started this am with brbpr. He has also had several episodes of vomiting and not eating well. He has had diverticular bleed before earlier this year and this will be his 2nd hospitalization this year for same issue--on her Silvey reports at bedside that he just finished course of oral antibiotics for diverticulitis at Dr. Evette Georges;' request 02/27/11.  He relates to me today that he has chronic arthritis and states that he does not take ibuprofen because he knows this is strong for his stomach but does take BC powders for arthritis pain. He states he's been taking this for long period time and only stopped taking it when he started to get "sick"  Spoke to Dr. Derrell Lolling of general surgery. Patient does not meet criteria for this Gen. Surgery or for emergent diverticular surgery however she's become hemodynamically unstable or develops any fever peritonitis or other acute abdominal signs may benefit from this Hemoglobin on admission was 6.7 transfuse 2 units and hemoglobin to 7.7. Patient then dropped to 7.1 again and was transfused further 2 units.  He has been pretty hemodynamically stable 11.18 and started to toelrate full meals and had 1 non-bloody stool    Subjective: No issues.  Daughters and friends in room No cp/n/v/sob--states has been up walking  Treatment Team:  Barrie Folk, MD Objective: Vital signs in last 24 hours: Temp:  [98 F (36.7 C)-98.4 F (36.9 C)] 98.3 F (36.8 C) (11/18 1400) Pulse Rate:  [71-74] 71  (11/18 1400) Resp:  [18-20] 18  (11/18 1400) BP: (173-200)/(74-99) 176/87 mmHg (11/18 1400) SpO2:  [97 %-98 %] 98 % (11/18 1400) Weight change:   Intake/Output Summary (Last 24 hours) at 03/07/11 1930 Last data filed at 03/07/11 1500  Gross per 24 hour  Intake   1480  ml  Output    350 ml  Net   1130 ml    General appearance: alert and cooperative  Eyes: negative  Throat: lips, mucosa, and tongue normal; teeth and gums normal  Lungs: clear to auscultation bilaterally and normal percussion bilaterally  Heart: regular rate and rhythm, S1, S2 normal, no murmur, click, rub or gallop-tele=6 bts of Vtach which were non-sustained Abdomen: abnormal findings: mild tenderness in the LLQ  Extremities: extremities normal, atraumatic, no cyanosis or edema   Lab Results:  Basename 03/07/11 1615 03/07/11 0703 03/05/11 0320  WBC -- -- 4.0  NEUTROABS -- -- --  HGB 9.8* 10.3* --  HCT 28.9* 30.2* --  MCV -- -- 87.9  PLT -- -- 114*    Micro Results: Recent Results (from the past 240 hour(s))  MRSA PCR SCREENING     Status: Normal   Collection Time   03/04/11  4:33 AM      Component Value Range Status Comment   MRSA by PCR NEGATIVE  NEGATIVE  Final     All of the Patients imaging studies have been reviewed-Please refer to prior notes  Medications: I have reviewed the patient's current medications. Scheduled Meds:   . ciprofloxacin  400 mg Intravenous Q12H  . ferrous sulfate  325 mg Oral Q breakfast  . finasteride  5 mg Oral Daily  . furosemide  20 mg Intravenous Once  . metoprolol  50 mg Oral Daily  . metronidazole  500 mg Intravenous  Q8H  . olmesartan  40 mg Oral Daily  . polyvinyl alcohol  1 drop Both Eyes BID  . silodosin  8 mg Oral Q breakfast  . DISCONTD: metoprolol  50 mg Oral Daily  . DISCONTD: olmesartan  20 mg Oral Daily   Continuous Infusions:  PRN Meds:.artificial tears, labetalol, morphine, ondansetron (ZOFRAN) IV, ondansetron Medications Discontinued During This Encounter  Medication Reason  . metoprolol (LOPRESSOR) 50 MG tablet Duplicate  . 0.9 %  sodium chloride infusion   . labetalol (NORMODYNE,TRANDATE) injection 5 mg   . Artificial Tear GEL 1 drop Formulary change  . metoprolol (TOPROL-XL) 24 hr tablet 50 mg   . olmesartan  (BENICAR) tablet 20 mg    Assessment/Plan:    Accelerated hypertension:-Will increase his ARB to 40 mg daily and continue His metoprolol 50 Xr-if still elevated bp in am, will add a CCB like Norvasc 5mg  GIB (gastrointestinal bleeding) (03/03/2011) Plan: Patient's GI bleed is likely secondary to diverticulitis. Patient has been followed by Dr. Carman Ching gastroenterology in the past. Patient has a history of stent placement in 2010 as well but obviously his bleeding currently will preclude the use of any type of antiplatelet therapy.  CBC a.m.  D5 ciprofloxacin and Flagyl/ duration 14 days to 21 days--changed this today to PO--Unclear what duration per GI, but as tolerating PO, will attempt as oral therapy  call gastroenterology M.D. for consult regarding possible upper GI component to his bleed.  Likely recurrence of bleeding from diverticula.   Aortic valve stenosis (03/03/2011) Assessment: Stable. This is considered mild per cardiac catheterization done in 2010.   CAD S/P percutaneous coronary angioplasty (03/03/2011) Assessment: Stable. Patient is to be on Plavix as per above discussion. Patient may benefit from an upper endoscopy to determine if he has any erosions. If this is felt to be the case patient would probably benefit more from aspirin then from Plavix as he may have another bleed.  Will follow gastroenterology's recommendations regarding resuming any type of antiplatelet therapy--will likely need Colonoscopy and Endoscopy if risks and benefits can be weighed fairly  Abdominal pain, left lower quadrant (03/03/2011) Assessment: Secondary to #1   Acute blood loss anemia (03/03/2011) Assessment: Status post 4 units packed red blood cell transfusion. Hemoglobin appears to be stabilized at 9 which is an appropriate raise.   Diverticulitis of colon with bleeding (03/03/2011) Assessment: Stable. See above   History of  benign prostatic hypertrophy-this stable we will continue  Proscar           LOS: 4 days   Angelly Spearing,JAI 03/07/2011, 7:30 PM

## 2011-03-07 NOTE — Progress Notes (Signed)
  [facility] Progress Note  Subjective: The patient has no complaints of abdominal pain and states his last stool last night was nonbloody. He was given a solid breakfast today which she tolerated well.  Objective: Vital signs in last 24 hours: Temp:  [97.9 F (36.6 C)-98 F (36.7 C)] 98 F (36.7 C) (11/17 2146) Pulse Rate:  [65-72] 71  (11/18 0624) Resp:  [18] 18  (11/17 2146) BP: (173-220)/(75-99) 200/99 mmHg (11/18 0624) SpO2:  [97 %-98 %] 97 % (11/17 2146) Weight change:    PE: Unchanged.  Lab Results: Results for orders placed during the hospital encounter of 03/03/11 (from the past 24 hour(s))  HEMOGLOBIN AND HEMATOCRIT, BLOOD     Status: Abnormal   Collection Time   03/06/11  4:12 PM      Component Value Range   Hemoglobin 10.4 (*) 13.0 - 17.0 (g/dL)   HCT 16.1 (*) 09.6 - 52.0 (%)  HEMOGLOBIN AND HEMATOCRIT, BLOOD     Status: Abnormal   Collection Time   03/06/11 11:30 PM      Component Value Range   Hemoglobin 9.8 (*) 13.0 - 17.0 (g/dL)   HCT 04.5 (*) 40.9 - 52.0 (%)  HEMOGLOBIN AND HEMATOCRIT, BLOOD     Status: Abnormal   Collection Time   03/07/11  7:03 AM      Component Value Range   Hemoglobin 10.3 (*) 13.0 - 17.0 (g/dL)   HCT 81.1 (*) 91.4 - 52.0 (%)    Studies/Results: No results found.    Assessment: Diverticulitis with diverticular bleed, clinically improved on both counts.  Plan: Continue empiric treatment with Cipro and Flagyl for a total of 10 days. Continue to monitor stools and hemoglobin. No indication for colonoscopy at present. Advance diet as tolerated.    Laryssa Hassing C 03/07/2011, 9:53 AM

## 2011-03-08 MED ORDER — AMLODIPINE BESYLATE 5 MG PO TABS: 5.0000 mg | ORAL_TABLET | Freq: Every day | ORAL | Status: DC

## 2011-03-08 MED ORDER — METRONIDAZOLE 500 MG PO TABS: 500.0000 mg | ORAL_TABLET | Freq: Three times a day (TID) | ORAL | Status: AC

## 2011-03-08 MED ORDER — ARTIFICIAL TEARS OP OINT: TOPICAL_OINTMENT | OPHTHALMIC | Status: DC | PRN

## 2011-03-08 MED ORDER — CIPROFLOXACIN HCL 500 MG PO TABS: 500.0000 mg | ORAL_TABLET | Freq: Two times a day (BID) | ORAL | Status: AC

## 2011-05-07 NOTE — Discharge Summary (Signed)
78 yo highly functioning male lives at home with his wife of 74 years marriage comes in with dtr due to several days of worsening llq pain and then started this am with brbpr. He has also had several episodes of vomiting and not eating well. He has had diverticular bleed before earlier this year and this will be his 2nd hospitalization this year for same issue-- reports at bedside that he just finished course of oral antibiotics for diverticulitis at Dr. Evette Georges;' request 02/27/11.   Takes B/cPowders for Arthritis, no other NSAIDS Hemoglobin on admission was 6.7 transfuse 2 units and hemoglobin to 7.7. Patient then dropped to 7.1 again and was transfused further 2 units.   He has been pretty hemodynamically stable 11.18 and started to toelrate full meals and had 1 non-bloody stool He was seen by Gastroenterology on this admission and Dr. Sharyon Cable not to have any significant indication for Colonoscopy and was discharged home to complete a 10 day empriic Rx course for Diverticulitis with bleed

## 2011-06-20 ENCOUNTER — Emergency Department (HOSPITAL_COMMUNITY)
Admission: EM | Admit: 2011-06-20 | Discharge: 2011-06-20 | Disposition: A | Discharge status: Home / Self Care | Payer: Medicare Other | Attending: Emergency Medicine | Admitting: Emergency Medicine

## 2011-06-20 ENCOUNTER — Emergency Department (HOSPITAL_COMMUNITY): Payer: Medicare Other

## 2011-06-20 ENCOUNTER — Other Ambulatory Visit: Payer: Self-pay

## 2011-06-20 ENCOUNTER — Encounter (HOSPITAL_COMMUNITY): Payer: Self-pay | Admitting: *Deleted

## 2011-06-20 DIAGNOSIS — I10 Essential (primary) hypertension: Secondary | ICD-10-CM | POA: Insufficient documentation

## 2011-06-20 DIAGNOSIS — M129 Arthropathy, unspecified: Secondary | ICD-10-CM | POA: Insufficient documentation

## 2011-06-20 DIAGNOSIS — Z79899 Other long term (current) drug therapy: Secondary | ICD-10-CM | POA: Insufficient documentation

## 2011-06-20 DIAGNOSIS — R42 Dizziness and giddiness: Secondary | ICD-10-CM | POA: Insufficient documentation

## 2011-06-20 DIAGNOSIS — R55 Syncope and collapse: Secondary | ICD-10-CM | POA: Insufficient documentation

## 2011-06-20 DIAGNOSIS — I2510 Atherosclerotic heart disease of native coronary artery without angina pectoris: Secondary | ICD-10-CM | POA: Insufficient documentation

## 2011-06-20 LAB — GLUCOSE, CAPILLARY: Glucose-Capillary: 76 mg/dL (ref 70–99)

## 2011-06-20 LAB — URINALYSIS, ROUTINE W REFLEX MICROSCOPIC
Bilirubin Urine: NEGATIVE
Glucose, UA: NEGATIVE mg/dL
Hgb urine dipstick: NEGATIVE
Specific Gravity, Urine: 1.01 (ref 1.005–1.030)
pH: 7.5 (ref 5.0–8.0)

## 2011-06-20 LAB — DIFFERENTIAL
Basophils Relative: 0 % (ref 0–1)
Monocytes Relative: 15 % — ABNORMAL HIGH (ref 3–12)
Neutro Abs: 3 10*3/uL (ref 1.7–7.7)
Neutrophils Relative %: 64 % (ref 43–77)

## 2011-06-20 LAB — CBC
Hemoglobin: 10.4 g/dL — ABNORMAL LOW (ref 13.0–17.0)
MCHC: 32 g/dL (ref 30.0–36.0)
RBC: 3.55 MIL/uL — ABNORMAL LOW (ref 4.22–5.81)

## 2011-06-20 LAB — COMPREHENSIVE METABOLIC PANEL
ALT: 9 U/L (ref 0–53)
AST: 16 U/L (ref 0–37)
Albumin: 3.7 g/dL (ref 3.5–5.2)
Alkaline Phosphatase: 65 U/L (ref 39–117)
BUN: 19 mg/dL (ref 6–23)
Chloride: 106 mEq/L (ref 96–112)
Potassium: 5 mEq/L (ref 3.5–5.1)
Total Bilirubin: 0.2 mg/dL — ABNORMAL LOW (ref 0.3–1.2)

## 2011-06-20 LAB — POCT I-STAT TROPONIN I

## 2011-06-20 LAB — PROTIME-INR: Prothrombin Time: 14.4 seconds (ref 11.6–15.2)

## 2011-06-20 MED ORDER — ONDANSETRON HCL 4 MG/2ML IJ SOLN
INTRAMUSCULAR | Status: AC
  Filled 2011-06-20: qty 2

## 2011-06-20 NOTE — ED Notes (Signed)
Gave old and new ECG to Dr. Alto Denver after I performed. 12:36 pm JG.

## 2011-06-20 NOTE — ED Provider Notes (Signed)
History     CSN: [id number]  Arrival date & time 06/20/11  1214   First MD Initiated Contact with Patient 06/20/11 1239      Chief Complaint  Patient presents with  . Near Syncope  . Dizziness    (Consider location/radiation/quality/duration/timing/severity/associated sxs/prior treatment) HPI Patient is a 78 yo male who presents today by EMS after nearly passing out in church.  Patient has no history of syncope or seizures.  He was sitting on a pew when this occurs.  He denies any chest pain, shortness of breath, or neurologic symptoms.  He was feeling well prior to the incident and denies any symptoms at this time.  Bystanders report that there was no seizure like activity and that they patient was responsive shortly after.  There are no associated or modifying factors. Past Medical History  Diagnosis Date  . Hypertension   . Bronchitis   . Arthritis   . Pneumonia     hx of  . CAD (coronary artery disease)     stent RCA 2010-[name]    Past Surgical History  Procedure Date  . Coronary stent placement   . Hernia repair     x 2    History reviewed. No pertinent family history.  History  Substance Use Topics  . Smoking status: Never Smoker   . Smokeless tobacco: Former Neurosurgeon    Types: Snuff, Chew  . Alcohol Use: No      Review of Systems  Constitutional: Negative.   HENT: Negative.   Eyes: Negative.   Respiratory: Negative.   Cardiovascular: Negative.   Gastrointestinal: Negative.   Genitourinary: Negative.   Musculoskeletal: Negative.   Skin: Negative.   Neurological:       See HPI  Hematological: Negative.   Psychiatric/Behavioral: Negative.   All other systems reviewed and are negative.    Allergies  Penicillins  Home Medications   Current Outpatient Rx  Name Route Sig Dispense Refill  . ACETAMINOPHEN-CODEINE #3 300-30 MG PO TABS Oral Take 1 tablet by mouth every 4 (four) hours as needed. pain    . AMLODIPINE BESYLATE 5 MG PO TABS Oral Take 1  tablet (5 mg total) by mouth daily. 30 tablet 0  . ARTIFICIAL TEAR OP Ophthalmic Apply 1 drop to eye daily as needed. For dry eyes    . ARTIFICIAL TEARS OP OINT Ophthalmic Apply to eye every 4 (four) hours as needed. 1 Tube 0  . DEXLANSOPRAZOLE 60 MG PO CPDR Oral Take 60 mg by mouth daily with breakfast.    . FERROUS SULFATE 325 (65 FE) MG PO TABS Oral Take 325 mg by mouth daily with breakfast.      . FINASTERIDE 5 MG PO TABS Oral Take 5 mg by mouth daily.     Marland Kitchen METOPROLOL SUCCINATE ER 50 MG PO TB24 Oral Take 50 mg by mouth daily.      Marland Kitchen OLMESARTAN MEDOXOMIL 40 MG PO TABS Oral Take 20 mg by mouth daily.     Marland Kitchen ROSUVASTATIN CALCIUM 20 MG PO TABS Oral Take 20 mg by mouth every morning.    Marland Kitchen SILODOSIN 4 MG PO CAPS Oral Take 8 mg by mouth daily with breakfast.     . TRIAMCINOLONE ACETONIDE 0.1 % EX CREA Topical Apply 1 application topically 2 (two) times daily. To eczema      BP 163/71  Pulse 62  Temp(Src) 98.5 F (36.9 C) (Oral)  Resp 15  SpO2 100%  Physical Exam  Nursing note and  vitals reviewed. GEN: Well-developed, well-nourished male in no distress HEENT: Atraumatic, normocephalic. Oropharynx clear without erythema EYES: PERRLA BL, no scleral icterus. NECK: Trachea midline, no meningismus CV: regular rate and rhythm. No murmurs, rubs, or gallops PULM: No respiratory distress.  No crackles, wheezes, or rales. GI: soft, non-tender. No guarding, rebound, or tenderness. + bowel sounds  Neuro: cranial nerves 2-12 intact, no abnormalities of strength or sensation, A and O x 3 MSK: Patient moves all 4 extremities symmetrically, no deformity, edema, or injury noted Psych: no abnormality of mood   ED Course  Procedures (including critical care time)   Date: 06/20/2011  Rate: 64  Rhythm: normal sinus rhythm  QRS Axis: normal  Intervals: normal  ST/T Wave abnormalities: nonspecific ST/T changes  Conduction Disutrbances:none  Narrative Interpretation:   Old EKG Reviewed:  unchanged  Labs Reviewed  CBC - Abnormal; Notable for the following:    RBC 3.55 (*)    Hemoglobin 10.4 (*)    HCT 32.5 (*)    Platelets 120 (*)    All other components within normal limits  DIFFERENTIAL - Abnormal; Notable for the following:    Monocytes Relative 15 (*)    All other components within normal limits  COMPREHENSIVE METABOLIC PANEL - Abnormal; Notable for the following:    Total Bilirubin 0.2 (*)    GFR calc non Af Amer 44 (*)    GFR calc Af Amer 51 (*)    All other components within normal limits  PROTIME-INR  URINALYSIS, ROUTINE W REFLEX MICROSCOPIC  POCT I-STAT TROPONIN I  GLUCOSE, CAPILLARY  GLUCOSE, CAPILLARY   Dg Chest 2 View  06/20/2011  *RADIOLOGY REPORT*  Clinical Data: Near-syncope, dizziness, nausea, coronary stent  CHEST - 2 VIEW  Comparison: 09/02/2010 and earlier studies  Findings: Stable mild cardiomegaly.  Atheromatous aortic arch. Left infrahilar linear scarring or subsegmental atelectasis.  Lungs otherwise clear.  No effusion.  Regional bones unremarkable.  IMPRESSION:  1.  Stable mild cardiomegaly.  Original Report Authenticated By: Osa Craver, M.D.   Ct Head Wo Contrast  06/20/2011  *RADIOLOGY REPORT*  Clinical Data: Dizziness and near syncope.  Blurred vision  CT HEAD WITHOUT CONTRAST  Technique:  Contiguous axial images were obtained from the base of the skull through the vertex without contrast.  Comparison: CT 09/02/2010  Findings: Generalized atrophy, unchanged.  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 Chronic ischemic changes in the white matter bilaterally.  Negative for acute infarct, hemorrhage, or mass.  Calvarium is intact.  IMPRESSION: Atrophy and chronic ischemic change.  No acute abnormality.  Original Report Authenticated By: Camelia Phenes, M.D.     1. Near syncope       MDM  Patient had complete work-up for near syncope including ECG, TNI, CT head, CXR, CBC, UA, and CMP.   Patient remained asymptomatic throughout and desired discharge home.  Family felt similarly and patient was discharged in good condition.        Cyndra Numbers, MD 06/22/11 [phone]

## 2011-06-20 NOTE — ED Notes (Signed)
CBG was 76 when I checked. It 1:08 pm JG.

## 2011-06-20 NOTE — ED Notes (Signed)
Pt and family members reported that pt had coat on when ems pick him up, called ems, they have coat on the truck and will bring it back. Pt dc home and told that coat will be at triage waiting, have called ems several times about returning the coat to ed

## 2011-06-20 NOTE — ED Notes (Signed)
Pt's CBG was 83 when I checked it. 2:09pm JG.

## 2011-06-20 NOTE — ED Notes (Signed)
Pt arrived by [facility]. Was at church and had onset of dizziness and near syncope. Pt was pale and diaphoretic, was lying on floor on ems arrival, had vomiting pta, given zofran 4mg  ivp pta.

## 2011-06-23 ENCOUNTER — Other Ambulatory Visit: Payer: Self-pay | Admitting: Cardiology

## 2011-12-11 ENCOUNTER — Other Ambulatory Visit: Payer: Self-pay | Admitting: Cardiology

## 2012-06-17 ENCOUNTER — Encounter (HOSPITAL_COMMUNITY): Payer: Self-pay | Admitting: *Deleted

## 2012-06-17 ENCOUNTER — Inpatient Hospital Stay (HOSPITAL_COMMUNITY)
Admission: EM | Admit: 2012-06-17 | Discharge: 2012-06-19 | DRG: 918 | Disposition: A | Discharge status: Home Health | Payer: Medicare Other | Attending: Internal Medicine | Admitting: Internal Medicine

## 2012-06-17 DIAGNOSIS — I359 Nonrheumatic aortic valve disorder, unspecified: Secondary | ICD-10-CM | POA: Diagnosis present

## 2012-06-17 DIAGNOSIS — N400 Enlarged prostate without lower urinary tract symptoms: Secondary | ICD-10-CM | POA: Diagnosis present

## 2012-06-17 DIAGNOSIS — Z79899 Other long term (current) drug therapy: Secondary | ICD-10-CM

## 2012-06-17 DIAGNOSIS — E871 Hypo-osmolality and hyponatremia: Secondary | ICD-10-CM | POA: Diagnosis present

## 2012-06-17 DIAGNOSIS — R1032 Left lower quadrant pain: Secondary | ICD-10-CM

## 2012-06-17 DIAGNOSIS — I350 Nonrheumatic aortic (valve) stenosis: Secondary | ICD-10-CM

## 2012-06-17 DIAGNOSIS — K922 Gastrointestinal hemorrhage, unspecified: Secondary | ICD-10-CM

## 2012-06-17 DIAGNOSIS — I10 Essential (primary) hypertension: Secondary | ICD-10-CM

## 2012-06-17 DIAGNOSIS — I2510 Atherosclerotic heart disease of native coronary artery without angina pectoris: Secondary | ICD-10-CM

## 2012-06-17 DIAGNOSIS — M129 Arthropathy, unspecified: Secondary | ICD-10-CM | POA: Diagnosis present

## 2012-06-17 DIAGNOSIS — R5381 Other malaise: Secondary | ICD-10-CM | POA: Diagnosis present

## 2012-06-17 DIAGNOSIS — Z8701 Personal history of pneumonia (recurrent): Secondary | ICD-10-CM

## 2012-06-17 DIAGNOSIS — T465X1A Poisoning by other antihypertensive drugs, accidental (unintentional), initial encounter: Secondary | ICD-10-CM

## 2012-06-17 DIAGNOSIS — T50901A Poisoning by unspecified drugs, medicaments and biological substances, accidental (unintentional), initial encounter: Secondary | ICD-10-CM

## 2012-06-17 DIAGNOSIS — T46901A Poisoning by unspecified agents primarily affecting the cardiovascular system, accidental (unintentional), initial encounter: Secondary | ICD-10-CM | POA: Diagnosis present

## 2012-06-17 DIAGNOSIS — Y92009 Unspecified place in unspecified non-institutional (private) residence as the place of occurrence of the external cause: Secondary | ICD-10-CM

## 2012-06-17 DIAGNOSIS — T46904A Poisoning by unspecified agents primarily affecting the cardiovascular system, undetermined, initial encounter: Principal | ICD-10-CM | POA: Diagnosis present

## 2012-06-17 DIAGNOSIS — D62 Acute posthemorrhagic anemia: Secondary | ICD-10-CM

## 2012-06-17 LAB — COMPREHENSIVE METABOLIC PANEL
ALT: 9 U/L (ref 0–53)
Albumin: 3.4 g/dL — ABNORMAL LOW (ref 3.5–5.2)
Alkaline Phosphatase: 56 U/L (ref 39–117)
Calcium: 8.9 mg/dL (ref 8.4–10.5)
Potassium: 4 mEq/L (ref 3.5–5.1)
Sodium: 127 mEq/L — ABNORMAL LOW (ref 135–145)
Total Protein: 7.3 g/dL (ref 6.0–8.3)

## 2012-06-17 LAB — CBC
MCH: 29.5 pg (ref 26.0–34.0)
MCHC: 33 g/dL (ref 30.0–36.0)
MCV: 89.3 fL (ref 78.0–100.0)
Platelets: 141 10*3/uL — ABNORMAL LOW (ref 150–400)
RBC: 3.63 MIL/uL — ABNORMAL LOW (ref 4.22–5.81)
RDW: 15.4 % (ref 11.5–15.5)

## 2012-06-17 LAB — CBC WITH DIFFERENTIAL/PLATELET
Basophils Absolute: 0 10*3/uL (ref 0.0–0.1)
Eosinophils Relative: 2 % (ref 0–5)
HCT: 31.8 % — ABNORMAL LOW (ref 39.0–52.0)
Hemoglobin: 10.5 g/dL — ABNORMAL LOW (ref 13.0–17.0)
Lymphocytes Relative: 24 % (ref 12–46)
MCHC: 33 g/dL (ref 30.0–36.0)
MCV: 89.6 fL (ref 78.0–100.0)
Monocytes Absolute: 0.6 10*3/uL (ref 0.1–1.0)
Monocytes Relative: 13 % — ABNORMAL HIGH (ref 3–12)
RDW: 15.5 % (ref 11.5–15.5)
WBC: 4.2 10*3/uL (ref 4.0–10.5)

## 2012-06-17 LAB — POCT I-STAT TROPONIN I: Troponin i, poc: 0.01 ng/mL (ref 0.00–0.08)

## 2012-06-17 LAB — CREATININE, SERUM: Creatinine, Ser: 1.21 mg/dL (ref 0.50–1.35)

## 2012-06-17 MED ORDER — SODIUM CHLORIDE 0.9 % IV BOLUS (SEPSIS)
1000.0000 mL | Freq: Once | INTRAVENOUS | Status: AC
  Administered 2012-06-17: 1000 mL via INTRAVENOUS

## 2012-06-17 MED ORDER — ACETAMINOPHEN 325 MG PO TABS
650.0000 mg | ORAL_TABLET | Freq: Four times a day (QID) | ORAL | Status: DC | PRN
  Administered 2012-06-17 – 2012-06-18 (×2): 650 mg via ORAL
  Filled 2012-06-17 (×2): qty 2

## 2012-06-17 MED ORDER — LEVALBUTEROL HCL 0.63 MG/3ML IN NEBU
0.6300 mg | INHALATION_SOLUTION | Freq: Four times a day (QID) | RESPIRATORY_TRACT | Status: DC | PRN
  Filled 2012-06-17: qty 3

## 2012-06-17 MED ORDER — SODIUM CHLORIDE 0.9 % IV SOLN
INTRAVENOUS | Status: DC
  Administered 2012-06-17 – 2012-06-18 (×2): via INTRAVENOUS

## 2012-06-17 MED ORDER — SODIUM CHLORIDE 0.9 % IV SOLN: 250.0000 mL | INTRAVENOUS | Status: DC | PRN

## 2012-06-17 MED ORDER — ONDANSETRON HCL 4 MG/2ML IJ SOLN: 4.0000 mg | Freq: Four times a day (QID) | INTRAMUSCULAR | Status: DC | PRN

## 2012-06-17 MED ORDER — ATORVASTATIN CALCIUM 10 MG PO TABS
10.0000 mg | ORAL_TABLET | Freq: Every day | ORAL | Status: DC
  Administered 2012-06-18 – 2012-06-19 (×2): 10 mg via ORAL
  Filled 2012-06-17 (×3): qty 1

## 2012-06-17 MED ORDER — TAMSULOSIN HCL 0.4 MG PO CAPS
0.4000 mg | ORAL_CAPSULE | Freq: Every day | ORAL | Status: DC
  Administered 2012-06-18 – 2012-06-19 (×2): 0.4 mg via ORAL
  Filled 2012-06-17 (×2): qty 1

## 2012-06-17 MED ORDER — SODIUM CHLORIDE 0.9 % IJ SOLN: 3.0000 mL | Freq: Two times a day (BID) | INTRAMUSCULAR | Status: DC

## 2012-06-17 MED ORDER — PANTOPRAZOLE SODIUM 40 MG PO TBEC
40.0000 mg | DELAYED_RELEASE_TABLET | Freq: Every day | ORAL | Status: DC
  Administered 2012-06-17 – 2012-06-19 (×3): 40 mg via ORAL
  Filled 2012-06-17 (×2): qty 1

## 2012-06-17 MED ORDER — ACETAMINOPHEN 650 MG RE SUPP: 650.0000 mg | Freq: Four times a day (QID) | RECTAL | Status: DC | PRN

## 2012-06-17 MED ORDER — METOPROLOL SUCCINATE ER 50 MG PO TB24
50.0000 mg | ORAL_TABLET | Freq: Every day | ORAL | Status: DC
  Administered 2012-06-18 – 2012-06-19 (×2): 50 mg via ORAL
  Filled 2012-06-17 (×2): qty 1

## 2012-06-17 MED ORDER — DIFLUPREDNATE 0.05 % OP EMUL: 1.0000 [drp] | Freq: Two times a day (BID) | OPHTHALMIC | Status: DC

## 2012-06-17 MED ORDER — SODIUM CHLORIDE 0.9 % IJ SOLN: 3.0000 mL | INTRAMUSCULAR | Status: DC | PRN

## 2012-06-17 MED ORDER — ENOXAPARIN SODIUM 40 MG/0.4ML ~~LOC~~ SOLN
40.0000 mg | SUBCUTANEOUS | Status: DC
  Administered 2012-06-18: 40 mg via SUBCUTANEOUS
  Filled 2012-06-17 (×3): qty 0.4

## 2012-06-17 MED ORDER — ONDANSETRON HCL 4 MG PO TABS
4.0000 mg | ORAL_TABLET | Freq: Four times a day (QID) | ORAL | Status: DC | PRN
  Administered 2012-06-18: 4 mg via ORAL
  Filled 2012-06-17: qty 1

## 2012-06-17 MED ORDER — SODIUM CHLORIDE 0.9 % IV SOLN: INTRAVENOUS | Status: DC

## 2012-06-17 MED ORDER — FERROUS SULFATE 325 (65 FE) MG PO TABS
325.0000 mg | ORAL_TABLET | Freq: Every day | ORAL | Status: DC
  Administered 2012-06-18 – 2012-06-19 (×2): 325 mg via ORAL
  Filled 2012-06-17 (×3): qty 1

## 2012-06-17 MED ORDER — NEPAFENAC 0.3 % OP SUSP
1.0000 [drp] | Freq: Every day | OPHTHALMIC | Status: DC
Start: 2012-06-17 — End: 2012-06-19

## 2012-06-17 MED ORDER — BESIFLOXACIN HCL 0.6 % OP SUSP: 1.0000 [drp] | Freq: Two times a day (BID) | OPHTHALMIC | Status: DC

## 2012-06-17 MED ORDER — AMLODIPINE BESYLATE 5 MG PO TABS
5.0000 mg | ORAL_TABLET | Freq: Every day | ORAL | Status: DC
  Filled 2012-06-17 (×2): qty 1

## 2012-06-17 NOTE — H&P (Addendum)
[facility] History and Physical  Luke Sanchez ZOX:[id number] DOB: [birth year] DOA: 06/17/2012  Referring physician:  [name], MD  PCP: No primary provider on file.   Chief Complaint: Drug Overdose  accidental    HPI:  79 yo highly functioning male lives at home with his wife of 54 years marriage comes in after an accidental ingestion. The patient took 4 Benicar tablets, rather than his typical 1, just prior to presentation. He denies physical complaints, confusion, disorientation, nausea or any other focal changes in his baseline medical condition.  He has a history of hypertension, prostate hypertrophy, ongoing difficulty initiating a urinary stream and increased urinary frequency at night . He had a negative UA , at his urologists office         Review of Systems: negative for the following  Constitutional: Denies fever, chills, diaphoresis, appetite change and fatigue.  HEENT: Denies photophobia, eye pain, redness, hearing loss, ear pain, congestion, sore throat, rhinorrhea, sneezing, mouth sores, trouble swallowing, neck pain, neck stiffness and tinnitus.  Respiratory: Denies SOB, DOE, cough, chest tightness, and wheezing.  Cardiovascular: Denies chest pain, palpitations and leg swelling.  Gastrointestinal: Denies nausea, vomiting, abdominal pain, diarrhea, constipation, blood in stool and abdominal distention.  Genitourinary: Denies dysuria, urgency, frequency, hematuria, flank pain and difficulty urinating.  Musculoskeletal: Denies myalgias, back pain, joint swelling, arthralgias and gait problem.  Skin: Denies pallor, rash and wound.  Neurological: Denies dizziness, seizures, syncope, weakness, light-headedness, numbness and headaches.  Hematological: Denies adenopathy. Easy bruising, personal or family bleeding history  Psychiatric/Behavioral: Denies suicidal ideation, mood changes, confusion, nervousness, sleep disturbance and agitation       Past Medical  History  Diagnosis Date  . Hypertension   . Bronchitis   . Arthritis   . Pneumonia     hx of  . CAD (coronary artery disease)     stent RCA 2010-[name]     Past Surgical History  Procedure Laterality Date  . Coronary stent placement    . Hernia repair      x 2      Social History:  reports that he has never smoked. He has quit using smokeless tobacco. His smokeless tobacco use included Snuff and Chew. He reports that he does not drink alcohol or use illicit drugs.      Allergies  Allergen Reactions  . Penicillins Anaphylaxis    No family history on file.   Prior to Admission medications   Medication Sig Start Date End Date Taking? Authorizing Provider  acetaminophen-codeine (TYLENOL #3) 300-30 MG per tablet Take 1 tablet by mouth every 4 (four) hours as needed. pain   Yes Historical Provider, MD  amLODipine (NORVASC) 5 MG tablet Take 5 mg by mouth daily.   Yes Historical Provider, MD  ARTIFICIAL TEAR OP Apply 1 drop to eye daily as needed. For dry eyes   Yes Historical Provider, MD  Besifloxacin HCl 0.6 % SUSP Place 1 drop into the right eye 2 (two) times daily.   Yes Historical Provider, MD  dexlansoprazole (DEXILANT) 60 MG capsule Take 60 mg by mouth daily with breakfast.   Yes Historical Provider, MD  Difluprednate (DUREZOL) 0.05 % EMUL Place 1 drop into the right eye 2 (two) times daily.   Yes Historical Provider, MD  ferrous sulfate 325 (65 FE) MG tablet Take 325 mg by mouth daily with breakfast.    Yes Historical Provider, MD  finasteride (PROSCAR) 5 MG tablet Take 5 mg by mouth daily.    Yes  Historical Provider, MD  metoprolol (TOPROL-XL) 50 MG 24 hr tablet Take 50 mg by mouth daily.    Yes Historical Provider, MD  Nepafenac 0.3 % SUSP Place 1 drop into the right eye daily.   Yes Historical Provider, MD  olmesartan (BENICAR) 40 MG tablet Take 20 mg by mouth daily.    Yes Historical Provider, MD  rosuvastatin (CRESTOR) 20 MG tablet Take 20 mg by mouth every  morning.   Yes Historical Provider, MD  silodosin (RAPAFLO) 4 MG CAPS capsule Take 8 mg by mouth daily with breakfast.    Yes Historical Provider, MD  triamcinolone cream (KENALOG) 0.1 % Apply 1 application topically 2 (two) times daily. To eczema   Yes Historical Provider, MD  amLODipine (NORVASC) 5 MG tablet Take 1 tablet (5 mg total) by mouth daily. 03/08/11 03/07/12  Rhetta Mura, MD     Physical Exam: Filed Vitals:   06/17/12 1415 06/17/12 1430 06/17/12 1445 06/17/12 1500  BP: 172/86 181/91 184/84 169/91  Pulse: 74 72 73 73  Temp:      TempSrc:      Resp: 26 22 17 19   SpO2: 99% 94% 98% 98%     Constitutional: Vital signs reviewed. Patient is a well-developed and well-nourished in no acute distress and cooperative with exam. Alert and oriented x3.  Head: Normocephalic and atraumatic  Ear: TM normal bilaterally  Mouth: no erythema or exudates, MMM  Eyes: PERRL, EOMI, conjunctivae normal, No scleral icterus.  Neck: Supple, Trachea midline normal ROM, No JVD, mass, thyromegaly, or carotid bruit present.  Cardiovascular: RRR, S1 normal, S2 normal, no MRG, pulses symmetric and intact bilaterally  Pulmonary/Chest: CTAB, no wheezes, rales, or rhonchi  Abdominal: Soft. Non-tender, non-distended, bowel sounds are normal, no masses, organomegaly, or guarding present.  GU: no CVA tenderness Musculoskeletal: No joint deformities, erythema, or stiffness, ROM full and no nontender Ext: no edema and no cyanosis, pulses palpable bilaterally (DP and PT)  Hematology: no cervical, inginal, or axillary adenopathy.  Neurological: A&O x3, Strenght is normal and symmetric bilaterally, cranial nerve II-XII are grossly intact, no focal motor deficit, sensory intact to light touch bilaterally.  Skin: Warm, dry and intact. No rash, cyanosis, or clubbing.  Psychiatric: Normal mood and affect. speech and behavior is normal. Judgment and thought content normal. Cognition and memory are normal.        Labs on Admission:    Basic Metabolic Panel:  Recent Labs Lab 06/17/12 1212  NA 127*  K 4.0  CL 93*  CO2 25  GLUCOSE 122*  BUN 18  CREATININE 1.33  CALCIUM 8.9   Liver Function Tests:  Recent Labs Lab 06/17/12 1212  AST 19  ALT 9  ALKPHOS 56  BILITOT 0.4  PROT 7.3  ALBUMIN 3.4*   No results found for this basename: LIPASE, AMYLASE,  in the last 168 hours No results found for this basename: AMMONIA,  in the last 168 hours CBC:  Recent Labs Lab 06/17/12 1212  WBC 4.2  NEUTROABS 2.5  HGB 10.5*  HCT 31.8*  MCV 89.6  PLT 144*   Cardiac Enzymes: No results found for this basename: CKTOTAL, CKMB, CKMBINDEX, TROPONINI,  in the last 168 hours  BNP (last 3 results) No results found for this basename: PROBNP,  in the last 8760 hours    CBG: No results found for this basename: GLUCAP,  in the last 168 hours  Radiological Exams on Admission: No results found.  EKG: Independently reviewed. Date: 06/17/2012  Rate:  76  Rhythm: normal sinus rhythm  QRS Axis: left  Intervals: normal  ST/T Wave abnormalities: nonspecific ST/T changes  Conduction Disutrbances:none  Narrative Interpretation:  Old EKG Reviewed: unchanged   Assessment/Plan Principal Problem:   Overdose Active Problems:   Aortic valve stenosis   HTN (hypertension)   This pleasant elderly male presents after a seemingly accidental ingestion of excessive Benicar. On exam the patient has no new focal complaints, is awake, alert, oriented to given the half-life of greater than 12 hours, and his age/comorbidities, he was admitted for further evaluation, monitoring, though initial labs were largely reassuring, notable mostly for hyponatremia  Hyponatremia-appears clinically dehydrated - with give NS  And see if this corrects , check Urine sodium, and serum urine osm   HTN hold benicar , continue norvasc and metoprolol, if stable overnight DC home in am   BPH will check PVR ,UA     Code Status:   full Family Communication: bedside  Disposition Plan: admit   Time spent: 70 mins   [facility] Pager [phone]  If 7PM-7AM, please contact night-coverage [email or web address] Password [facility] 06/17/2012, 3:18 PM

## 2012-06-17 NOTE — ED Provider Notes (Signed)
History     CSN: [id number]  Arrival date & time 06/17/12  1149   First MD Initiated Contact with Patient 06/17/12 1206      Chief Complaint  Patient presents with  . Drug Overdose    accidental    (Consider location/radiation/quality/duration/timing/severity/associated sxs/prior treatment) HPI The patient presents after an accidental ingestion.  The patient took 4 Benicar tablets, rather than his typical 1, just prior to presentation.  He denies physical complaints, confusion, disorientation, nausea or any other focal changes in his baseline medical condition. He has a history of hypertension, prostate hypertrophy, ongoing difficulty initiating a urinary stream, but otherwise no significant ongoing chronic complaints. History of present illness is per the patient and his daughter.  He denies other coingestants. Past Medical History  Diagnosis Date  . Hypertension   . Bronchitis   . Arthritis   . Pneumonia     hx of  . CAD (coronary artery disease)     stent RCA 2010-[name]    Past Surgical History  Procedure Laterality Date  . Coronary stent placement    . Hernia repair      x 2    No family history on file.  History  Substance Use Topics  . Smoking status: Never Smoker   . Smokeless tobacco: Former Neurosurgeon    Types: Snuff, Chew  . Alcohol Use: No      Review of Systems  Constitutional:       Per HPI, otherwise negative  HENT:       Per HPI, otherwise negative  Respiratory:       Per HPI, otherwise negative  Cardiovascular:       Per HPI, otherwise negative  Gastrointestinal: Negative for vomiting.  Endocrine:       Negative aside from HPI  Genitourinary:       Neg aside from HPI   Musculoskeletal:       Per HPI, otherwise negative  Skin: Negative.   Neurological: Negative for syncope.    Allergies  Penicillins  Home Medications   Current Outpatient Rx  Name  Route  Sig  Dispense  Refill  . acetaminophen-codeine (TYLENOL #3) 300-30 MG per  tablet   Oral   Take 1 tablet by mouth every 4 (four) hours as needed. pain         . EXPIRED: amLODipine (NORVASC) 5 MG tablet   Oral   Take 1 tablet (5 mg total) by mouth daily.   30 tablet   0   . ARTIFICIAL TEAR OP   Ophthalmic   Apply 1 drop to eye daily as needed. For dry eyes         . artificial tears (LACRILUBE) OINT ophthalmic ointment   Ophthalmic   Apply to eye every 4 (four) hours as needed.   1 Tube   0   . dexlansoprazole (DEXILANT) 60 MG capsule   Oral   Take 60 mg by mouth daily with breakfast.         . ferrous sulfate 325 (65 FE) MG tablet   Oral   Take 325 mg by mouth daily with breakfast.           . finasteride (PROSCAR) 5 MG tablet   Oral   Take 5 mg by mouth daily.          . metoprolol (TOPROL-XL) 50 MG 24 hr tablet   Oral   Take 50 mg by mouth daily.           Marland Kitchen  olmesartan (BENICAR) 40 MG tablet   Oral   Take 20 mg by mouth daily.          . rosuvastatin (CRESTOR) 20 MG tablet   Oral   Take 20 mg by mouth every morning.         . silodosin (RAPAFLO) 4 MG CAPS capsule   Oral   Take 8 mg by mouth daily with breakfast.          . triamcinolone cream (KENALOG) 0.1 %   Topical   Apply 1 application topically 2 (two) times daily. To eczema           BP 174/78  Pulse 85  Temp(Src) 98 F (36.7 C) (Oral)  Resp 20  SpO2 90%  Physical Exam  Nursing note and vitals reviewed. Constitutional: He is oriented to person, place, and time. He appears well-developed. No distress.  HENT:  Head: Normocephalic and atraumatic.  Eyes: Conjunctivae and EOM are normal.  Cardiovascular: Normal rate and regular rhythm.   Murmur heard. Pulmonary/Chest: Effort normal. No stridor. No respiratory distress.  Abdominal: He exhibits no distension.  Musculoskeletal: He exhibits no edema.  Neurological: He is alert and oriented to person, place, and time. No cranial nerve deficit. He exhibits normal muscle tone. Coordination normal.   Skin: Skin is warm and dry.  Psychiatric: He has a normal mood and affect.    ED Course  Procedures (including critical care time)  Labs Reviewed  COMPREHENSIVE METABOLIC PANEL  CBC WITH DIFFERENTIAL   No results found.   No diagnosis found.   IV fluids initiated on arrival with concern for impending hypotension.  Cardiac 70 sinus rhythm normal Pulse ox 99% room air normal     Date: 06/17/2012  Rate: 76  Rhythm: normal sinus rhythm  QRS Axis: left  Intervals: normal  ST/T Wave abnormalities: nonspecific ST/T changes  Conduction Disutrbances:none  Narrative Interpretation:   Old EKG Reviewed: unchanged ABNORMAL  Recheck: no new complaints.  VS remain stable.  Following initial evaluation was in control resources obtained.  The acute onset of effects for angiotensin receptor blockers should be between 1 and 4 hours, with hypotension, tachycardia anticipated.  However, the patient did take his amlodipine as well, as well as metoprolol.  He took 160 mg Benicar, half-life 13 hours   BP 140 - systolic - no new complaints  Labs notable for hyponatremia - IVF running.   1:37 PM BP 140 systolic - he is urinating. MDM  This pleasant elderly male presents after a seemingly accidental ingestion of excessive Benicar.  On exam the patient has no new focal complaints, is awake, alert, oriented to given the half-life of greater than 12 hours, and his age/comorbidities, he was admitted for further evaluation, monitoring, though initial labs were largely reassuring, notable mostly for hyponatremia.   CRITICAL CARE Performed by: [name]   Total critical care time: 35  Critical care time was exclusive of separately billable procedures and treating other patients.  Critical care was necessary to treat or prevent imminent or life-threatening deterioration.  Critical care was time spent personally by me on the following activities: development of treatment plan with  patient and/or surrogate as well as nursing, discussions with consultants, evaluation of patient's response to treatment, examination of patient, obtaining history from patient or surrogate, ordering and performing treatments and interventions, ordering and review of laboratory studies, ordering and review of radiographic studies, pulse oximetry and re-evaluation of patient's condition.  [name], MD 06/17/12 [phone]

## 2012-06-17 NOTE — ED Notes (Signed)
PT CG reports Pt took 4 tablets of benicar that are 40mg  each tis AM.

## 2012-06-17 NOTE — ED Notes (Signed)
Pt brought here by daughter stating that he accidentally took 4 bencar and all the rest of his meds at 11:15.  She called pcp and he said to come here.

## 2012-06-18 DIAGNOSIS — I10 Essential (primary) hypertension: Secondary | ICD-10-CM

## 2012-06-18 LAB — CBC
HCT: 30.1 % — ABNORMAL LOW (ref 39.0–52.0)
MCH: 28.7 pg (ref 26.0–34.0)
MCHC: 32.2 g/dL (ref 30.0–36.0)
MCV: 89.1 fL (ref 78.0–100.0)
Platelets: 141 10*3/uL — ABNORMAL LOW (ref 150–400)
RDW: 15.8 % — ABNORMAL HIGH (ref 11.5–15.5)
WBC: 4.3 10*3/uL (ref 4.0–10.5)

## 2012-06-18 LAB — CLOSTRIDIUM DIFFICILE BY PCR: Toxigenic C. Difficile by PCR: NEGATIVE

## 2012-06-18 LAB — BASIC METABOLIC PANEL
BUN: 17 mg/dL (ref 6–23)
CO2: 26 mEq/L (ref 19–32)
Calcium: 8.7 mg/dL (ref 8.4–10.5)
Chloride: 101 mEq/L (ref 96–112)
Creatinine, Ser: 1.33 mg/dL (ref 0.50–1.35)
GFR calc Af Amer: 51 mL/min — ABNORMAL LOW (ref >90)
GFR calc non Af Amer: 44 mL/min — ABNORMAL LOW (ref >90)
Glucose, Bld: 87 mg/dL (ref 70–99)
Potassium: 4.6 mEq/L (ref 3.5–5.1)
Sodium: 134 mEq/L — ABNORMAL LOW (ref 135–145)

## 2012-06-18 MED ORDER — AMLODIPINE BESYLATE 10 MG PO TABS: 10.0000 mg | ORAL_TABLET | Freq: Every day | ORAL | Status: DC

## 2012-06-18 MED ORDER — AMLODIPINE BESYLATE 10 MG PO TABS
10.0000 mg | ORAL_TABLET | Freq: Every day | ORAL | Status: DC
  Administered 2012-06-18 – 2012-06-19 (×2): 10 mg via ORAL
  Filled 2012-06-18 (×2): qty 1

## 2012-06-18 MED ORDER — HYDRALAZINE HCL 20 MG/ML IJ SOLN: 10.0000 mg | Freq: Four times a day (QID) | INTRAMUSCULAR | Status: DC | PRN

## 2012-06-18 MED ORDER — AMLODIPINE BESYLATE 5 MG PO TABS: 5.0000 mg | ORAL_TABLET | Freq: Every day | ORAL | Status: DC

## 2012-06-18 NOTE — Progress Notes (Signed)
Patient ID: Luke Sanchez  male  ZOX:[id number]    DOB: Oct 31, 1914    DOA: 06/17/2012  PCP: No primary provider on file.  Assessment/Plan: Principal Problem:   Accidental Benicar Overdose: - Alert and oriented however now having diarrhea likely secondary to a side effect from Benicar overdose, will check C. Difficile. Monitor for other side effects. - check CPK, continue gentle hydration, t 1/2 =13 hours   Active Problems:    HTN (hypertension): - placed on Norvasc and beta blocker  Hyponatremia: Likely secondary to dehydration, improving with normal saline  Generalized debility: PT eval  DVT Prophylaxis:  Code Status: Full  Disposition: hopefully tomorrow    Subjective: Having diarrhea now,  No other complaints  Objective: Weight change:   Intake/Output Summary (Last 24 hours) at 06/18/12 1024 Last data filed at 06/18/12 0700  Gross per 24 hour  Intake    360 ml  Output      1 ml  Net    359 ml   Blood pressure 176/74, pulse 56, temperature 98.3 F (36.8 C), temperature source Oral, resp. rate 17, SpO2 97.00%.  Physical Exam: General: Alert and awake, oriented x3, not in any acute distress. HEENT: anicteric sclera, PERLA, EOMI CVS: S1-S2 clear Chest: CTAB Abdomen: soft NT, ND, NBS Extremities: no c/c/e bilaterally   Lab Results: Basic Metabolic Panel:  Recent Labs Lab 06/17/12 1212 06/17/12 1625 06/18/12 0458  NA 127*  --  134*  K 4.0  --  4.6  CL 93*  --  101  CO2 25  --  26  GLUCOSE 122*  --  87  BUN 18  --  17  CREATININE 1.33 1.21 1.33  CALCIUM 8.9  --  8.7   Liver Function Tests:  Recent Labs Lab 06/17/12 1212  AST 19  ALT 9  ALKPHOS 56  BILITOT 0.4  PROT 7.3  ALBUMIN 3.4*   No results found for this basename: LIPASE, AMYLASE,  in the last 168 hours No results found for this basename: AMMONIA,  in the last 168 hours CBC:  Recent Labs Lab 06/17/12 1212 06/17/12 1625 06/18/12 0458  WBC 4.2 4.0 4.3  NEUTROABS 2.5  --   --    HGB 10.5* 10.7* 9.7*  HCT 31.8* 32.4* 30.1*  MCV 89.6 89.3 89.1  PLT 144* 141* 141*   Studies/Results: No results found.  Medications: Scheduled Meds: . amLODipine  10 mg Oral Daily  . atorvastatin  10 mg Oral q1800  . Besifloxacin HCl  1 drop Right Eye BID  . Difluprednate  1 drop Right Eye BID  . enoxaparin (LOVENOX) injection  40 mg Subcutaneous Q24H  . ferrous sulfate  325 mg Oral Q breakfast  . metoprolol succinate  50 mg Oral Daily  . Nepafenac  1 drop Right Eye Daily  . pantoprazole  40 mg Oral Daily  . sodium chloride  3 mL Intravenous Q12H  . tamsulosin  0.4 mg Oral Daily      LOS: 1 day   RAI,RIPUDEEP M.D. [facility] 06/18/2012, 10:24 AM Pager: [phone]  If 7PM-7AM, please contact night-coverage [email or web address] Password [email or web address]

## 2012-06-19 LAB — BASIC METABOLIC PANEL
BUN: 15 mg/dL (ref 6–23)
CO2: 23 mEq/L (ref 19–32)
Calcium: 8.6 mg/dL (ref 8.4–10.5)
GFR calc non Af Amer: 49 mL/min — ABNORMAL LOW (ref >90)
Glucose, Bld: 88 mg/dL (ref 70–99)
Sodium: 130 mEq/L — ABNORMAL LOW (ref 135–145)

## 2012-06-19 MED ORDER — LOPERAMIDE HCL 2 MG PO TABS: 2.0000 mg | ORAL_TABLET | Freq: Four times a day (QID) | ORAL | Status: DC | PRN

## 2012-06-19 MED ORDER — LOPERAMIDE HCL 2 MG PO CAPS
2.0000 mg | ORAL_CAPSULE | Freq: Once | ORAL | Status: AC
  Administered 2012-06-19: 2 mg via ORAL
  Filled 2012-06-19: qty 1

## 2012-06-19 MED ORDER — AMLODIPINE BESYLATE 5 MG PO TABS: 10.0000 mg | ORAL_TABLET | Freq: Every day | ORAL | Status: AC

## 2012-06-19 NOTE — Progress Notes (Signed)
Pt provided with dc instructions and education. Pt verbalized understanding. Pt aware that absolutely NO BENICAR is to be taken. Pt aware of changes in new medications and aware of when to take meds again. Pt granddaughter also aware of all changes made to medication. VSS. IV removed with tip intact. Heart monitor cleaned and returned to front. Levonne Spiller, RN

## 2012-06-19 NOTE — Progress Notes (Signed)
Nutrition Brief Note  Patient identified on the Malnutrition Screening Tool (MST) Report.  Patient's weight has been stable per readings below:  Wt Readings from Last 10 Encounters:  06/19/12 141 lb 1.5 oz (64 kg)  03/05/11 142 lb 4.8 oz (64.547 kg)    Body mass index is 20.24 kg/(m^2). Patient meets criteria for Normal based on current BMI.   Current diet order is Heart Healthy, patient is consuming approximately 100% of meals at this time. Labs and medications reviewed.   No nutrition interventions warranted at this time. If nutrition issues arise, please consult RD.   Maureen Chatters, RD, LDN Pager #: [phone] After-Hours Pager #: [phone]

## 2012-06-19 NOTE — Discharge Summary (Signed)
Physician Discharge Summary  Patient ID: Luke Sanchez MRN: [id number] DOB/AGE: 1915/01/15 79 y.o.  Admit date: 06/17/2012 Discharge date: 06/19/2012  Primary Care Physician:  Evlyn Courier, MD  Discharge Diagnoses:    . Aortic valve stenosis Accidental Benicar overdose Uncontrolled hypertension Hyponatremia   Consults: none  Discharge Medications:   Medication List    STOP taking these medications       olmesartan 40 MG tablet  Commonly known as:  BENICAR      TAKE these medications       acetaminophen-codeine 300-30 MG per tablet  Commonly known as:  TYLENOL #3  Take 1 tablet by mouth every 4 (four) hours as needed. pain     amLODipine 5 MG tablet  Commonly known as:  NORVASC  Take 2 tablets (10 mg total) by mouth daily.     ARTIFICIAL TEAR OP  Apply 1 drop to eye daily as needed. For dry eyes     Besifloxacin HCl 0.6 % Susp  Place 1 drop into the right eye 2 (two) times daily.     DEXILANT 60 MG capsule  Generic drug:  dexlansoprazole  Take 60 mg by mouth daily with breakfast.     DUREZOL 0.05 % Emul  Generic drug:  Difluprednate  Place 1 drop into the right eye 2 (two) times daily.     ferrous sulfate 325 (65 FE) MG tablet  Take 325 mg by mouth daily with breakfast.     finasteride 5 MG tablet  Commonly known as:  PROSCAR  Take 5 mg by mouth daily.     loperamide 2 MG tablet  Commonly known as:  IMODIUM A-D  Take 1 tablet (2 mg total) by mouth 4 (four) times daily as needed for diarrhea or loose stools.     metoprolol succinate 50 MG 24 hr tablet  Commonly known as:  TOPROL-XL  Take 50 mg by mouth daily.     Nepafenac 0.3 % Susp  Place 1 drop into the right eye daily.     rosuvastatin 20 MG tablet  Commonly known as:  CRESTOR  Take 20 mg by mouth every morning.     silodosin 4 MG Caps capsule  Commonly known as:  RAPAFLO  Take 8 mg by mouth daily with breakfast.     triamcinolone cream 0.1 %  Commonly known as:  KENALOG  Apply 1  application topically 2 (two) times daily. To eczema         Brief H and P: For complete details please refer to admission H and P, but in brief 79 yo highly functioning male lives at home with his wife of 69 years marriage comes in after an accidental ingestion. The patient took 4 Benicar tablets, rather than his typical 1, just prior to presentation. He denied physical complaints, confusion, disorientation, nausea or any other focal changes in his baseline medical condition.  He has a history of hypertension, prostate hypertrophy, ongoing difficulty initiating a urinary stream and increased urinary frequency at night . He had a negative UA , at his urologists office     Hospital Course:  Accidental Benicar Overdose: Alert and oriented, patient was admistted for observation. Subsequently he started having diarrhea likely secondary to a side effect from Benicar overdose.  Cdiff was negative. Original closely for any side effects, CPK was normal, patient was gently hydrated.  HTN (hypertension):  placed on Norvasc and beta blocker. Benicar was discontinued until he follows up with his primary physician.  Hyponatremia: Likely secondary to dehydration, improved some with normal saline   Generalized debility: PT eval was done, patient was placed with home PT, OT, RN, social worker followup    Day of Discharge BP 143/78  Pulse 68  Temp(Src) 97.9 F (36.6 C) (Oral)  Resp 18  Ht 5\' 10"  (1.778 m)  Wt 64 kg (141 lb 1.5 oz)  BMI 20.24 kg/m2  SpO2 97%  Physical Exam: General: Alert and awake oriented x3 not in any acute distress. CVS: S1-S2 clear no murmur rubs or gallops Chest: clear to auscultation bilaterally, no wheezing rales or rhonchi Abdomen: soft nontender, nondistended, normal bowel sounds, no organomegaly Extremities: no cyanosis, clubbing or edema noted bilaterally    The results of significant diagnostics from this hospitalization (including imaging, microbiology,  ancillary and laboratory) are listed below for reference.    LAB RESULTS: Basic Metabolic Panel:  Recent Labs Lab 06/18/12 0458 06/19/12 0715  NA 134* 130*  K 4.6 4.4  CL 101 98  CO2 26 23  GLUCOSE 87 88  BUN 17 15  CREATININE 1.33 1.22  CALCIUM 8.7 8.6   Liver Function Tests:  Recent Labs Lab 06/17/12 1212  AST 19  ALT 9  ALKPHOS 56  BILITOT 0.4  PROT 7.3  ALBUMIN 3.4*   No results found for this basename: LIPASE, AMYLASE,  in the last 168 hours No results found for this basename: AMMONIA,  in the last 168 hours CBC:  Recent Labs Lab 06/17/12 1212 06/17/12 1625 06/18/12 0458  WBC 4.2 4.0 4.3  NEUTROABS 2.5  --   --   HGB 10.5* 10.7* 9.7*  HCT 31.8* 32.4* 30.1*  MCV 89.6 89.3 89.1  PLT 144* 141* 141*   Cardiac Enzymes:  Recent Labs Lab 06/18/12 1042  CKTOTAL 162   BNP: No components found with this basename: POCBNP,  CBG: No results found for this basename: GLUCAP,  in the last 168 hours  Significant Diagnostic Studies:  No results found.  2D ECHO:   Disposition and Follow-up:     Discharge Orders   Future Orders Complete By Expires     Diet - low sodium heart healthy  As directed     Discharge instructions  As directed     Comments:      Please STOP benicar until you follow-up with your primary doctor.    Increase activity slowly  As directed         DISPOSITION: home  DIET: heart healthy  ACTIVITY: as tolerated   DISCHARGE FOLLOW-UP Follow-up Information   Follow up with HILL,GERALD K, MD. Schedule an appointment as soon as possible for a visit in 10 days. (for hospital follow-up)    Contact information:   [address] [place] [address] [phone]       Time spent on Discharge: 35 mins  Signed:   RAI,RIPUDEEP M.D. [facility] 06/19/2012, 11:50 AM Pager: [phone]

## 2012-06-19 NOTE — Care Management Note (Signed)
    Page 1 of 2   06/19/2012     12:00:17 PM   CARE MANAGEMENT NOTE 06/19/2012  Patient:  Luke Sanchez, Luke Sanchez   Account Number:  [id number]  Date Initiated:  06/19/2012  Documentation initiated by:  GRAVES-BIGELOW,BRENDA  Subjective/Objective Assessment:   Pt admitted for overdose of bp meds. Pt is from home with daughter. He is active with [facility] for services.     Action/Plan:   CM did make referral with [facility] and plan is for d/c today. SOC to begin within 24-48 hours post d/c.   Anticipated DC Date:  06/19/2012   Anticipated DC Plan:  HOME W HOME HEALTH SERVICES      DC Planning Services  CM consult      [facility] Choice  HOME HEALTH  Resumption Of Svcs/PTA Provider   Choice offered to / List presented to:  C-4 Adult Children        HH arranged  HH-1 RN  HH-10 DISEASE MANAGEMENT  HH-2 PT  HH-3 OT  HH-6 SOCIAL WORKER      HH agency  [facility]   Status of service:  Completed, signed off Medicare Important Message given?   (If response is "NO", the following Medicare IM given date fields will be blank) Date Medicare IM given:   Date Additional Medicare IM given:    Discharge Disposition:  HOME W HOME HEALTH SERVICES  Per UR Regulation:  Reviewed for med. necessity/level of care/duration of stay  If discussed at Long Length of Stay Meetings, dates discussed:    Comments:

## 2012-06-19 NOTE — Evaluation (Signed)
Physical Therapy Evaluation Patient Details Name: Kristopher Attwood MRN: [id number] DOB: 05/18/1914 Today's Date: 06/19/2012 Time: 4098-1191 PT Time Calculation (min): 34 min  PT Assessment / Plan / Recommendation Clinical Impression  79 yo adm with accidental overdose of his BP medicine. Pt reports he is at his baseline for mobility and no further PT needs identified. Note, pt had been incontinent of bowels (diarrhea) in the bed prior to PT arrival and pt reports incontinence is NOT normal for him.    PT Assessment  Patent does not need any further PT services    Follow Up Recommendations  No PT follow up;Supervision/Assistance - 24 hour    Does the patient have the potential to tolerate intense rehabilitation      Barriers to Discharge        Equipment Recommendations  None recommended by PT    Recommendations for Other Services     Frequency      Precautions / Restrictions Restrictions Weight Bearing Restrictions: No   Pertinent Vitals/Pain no apparent distress       Mobility  Bed Mobility Bed Mobility: Rolling Right;Rolling Left;Right Sidelying to Sit;Sitting - Scoot to [employer] of Bed Rolling Right: 6: Modified independent (Device/Increase time) Rolling Left: 6: Modified independent (Device/Increase time) Right Sidelying to Sit: 4: Min assist;HOB flat Sitting - Scoot to Edge of Bed: 6: Modified independent (Device/Increase time) Details for Bed Mobility Assistance: Pt had been incontinent of stool (diarrhea) and performed pericare with rolling prior to getting OOB) Transfers Transfers: Sit to Stand;Stand to Sit Sit to Stand: 5: Supervision Stand to Sit: 5: Supervision Details for Transfer Assistance: vc for safe, proper use of RW (pt initially attempting to pull up on handles) Ambulation/Gait Ambulation/Gait Assistance: 4: Min guard Ambulation Distance (Feet): 120 Feet Assistive device: Rolling walker Ambulation/Gait Assistance Details: slightly kyphotic with pushing  RW slightly too far ahead of himself; vc for turning RW more sharply to not run into the wall Gait Pattern: Step-through pattern;Decreased stride length;Trunk flexed    Exercises     PT Diagnosis:    PT Problem List:   PT Treatment Interventions:     PT Goals    Visit Information  Last PT Received On: 06/19/12 Assistance Needed: +1    Subjective Data  Subjective: Reports he just started having "therapy" come to his house and help with bathing and dressing. Reports gradual decline in strength due to aging Patient Stated Goal: return home with daughter's assist   Prior Functioning  Home Living Lives With: Daughter Available Help at Discharge: Home health;Family;Available PRN/intermittently (dtr nearly 24/7, HH comes 2x/wk) Type of Home: House Home Access: Stairs to enter [employer] of Steps: 4 Entrance Stairs-Rails: None Home Layout: One level Bathroom Accessibility:  (unsure if RW would fit) Home Adaptive Equipment: Straight cane;Walker - rolling;Hand-held shower hose Prior Function Level of Independence: Needs assistance Needs Assistance: Games developer (steps) Able to Take Stairs?: Yes (needs help) Comments: reports he was modified I with cane until last week or 2 (denies specific change in status "just getting older and weaker") Communication Communication: No difficulties    Cognition  Cognition Overall Cognitive Status: Appears within functional limits for tasks assessed/performed Arousal/Alertness: Awake/alert Orientation Level: Oriented X4 / Intact Behavior During Session: [facility] for tasks performed    Extremity/Trunk Assessment Right Upper Extremity Assessment RUE ROM/Strength/Tone: [facility] for tasks assessed Left Upper Extremity Assessment LUE ROM/Strength/Tone: [facility] for tasks assessed Right Lower Extremity Assessment RLE ROM/Strength/Tone: [facility] for tasks assessed Left Lower Extremity Assessment LLE ROM/Strength/Tone:  WFL for tasks  assessed Trunk Assessment Trunk Assessment: Normal (wears soft cervical collar to sleep in )   Balance    End of Session PT - End of Session Activity Tolerance: Patient tolerated treatment well Patient left: in chair;with call bell/phone within reach Nurse Communication: Mobility status;Other (comment) (incontinent of diarrhea in bed )  GP Functional Assessment Tool Used: clinical judgment Functional Limitation: Mobility: Walking and moving around Mobility: Walking and Moving Around Current Status ([id number]): At least 1 percent but less than 20 percent impaired, limited or restricted Mobility: Walking and Moving Around Goal Status [phone]): At least 1 percent but less than 20 percent impaired, limited or restricted Mobility: Walking and Moving Around Discharge Status [phone]): At least 1 percent but less than 20 percent impaired, limited or restricted   SASSER,LYNN 06/19/2012, 10:04 AM Pager [phone]

## 2012-10-08 ENCOUNTER — Emergency Department (HOSPITAL_COMMUNITY)
Admission: EM | Admit: 2012-10-08 | Discharge: 2012-10-08 | Disposition: A | Discharge status: Home / Self Care | Payer: Medicare Other | Attending: Emergency Medicine | Admitting: Emergency Medicine

## 2012-10-08 ENCOUNTER — Emergency Department (HOSPITAL_COMMUNITY): Payer: Medicare Other

## 2012-10-08 ENCOUNTER — Encounter (HOSPITAL_COMMUNITY): Payer: Self-pay

## 2012-10-08 DIAGNOSIS — Z8709 Personal history of other diseases of the respiratory system: Secondary | ICD-10-CM | POA: Insufficient documentation

## 2012-10-08 DIAGNOSIS — M47812 Spondylosis without myelopathy or radiculopathy, cervical region: Secondary | ICD-10-CM

## 2012-10-08 DIAGNOSIS — Z79899 Other long term (current) drug therapy: Secondary | ICD-10-CM | POA: Insufficient documentation

## 2012-10-08 DIAGNOSIS — Z8739 Personal history of other diseases of the musculoskeletal system and connective tissue: Secondary | ICD-10-CM | POA: Insufficient documentation

## 2012-10-08 DIAGNOSIS — I10 Essential (primary) hypertension: Secondary | ICD-10-CM | POA: Insufficient documentation

## 2012-10-08 DIAGNOSIS — M5030 Other cervical disc degeneration, unspecified cervical region: Secondary | ICD-10-CM | POA: Insufficient documentation

## 2012-10-08 DIAGNOSIS — N289 Disorder of kidney and ureter, unspecified: Secondary | ICD-10-CM

## 2012-10-08 DIAGNOSIS — Z9861 Coronary angioplasty status: Secondary | ICD-10-CM | POA: Insufficient documentation

## 2012-10-08 DIAGNOSIS — Z8701 Personal history of pneumonia (recurrent): Secondary | ICD-10-CM | POA: Insufficient documentation

## 2012-10-08 DIAGNOSIS — I2510 Atherosclerotic heart disease of native coronary artery without angina pectoris: Secondary | ICD-10-CM | POA: Insufficient documentation

## 2012-10-08 LAB — POCT I-STAT, CHEM 8
Calcium, Ion: 1.18 mmol/L (ref 1.13–1.30)
Glucose, Bld: 97 mg/dL (ref 70–99)
HCT: 35 % — ABNORMAL LOW (ref 39.0–52.0)
Hemoglobin: 11.9 g/dL — ABNORMAL LOW (ref 13.0–17.0)
TCO2: 27 mmol/L (ref 0–100)

## 2012-10-08 MED ORDER — HYDROCODONE-ACETAMINOPHEN 5-325 MG PO TABS: 1.0000 | ORAL_TABLET | ORAL | Status: DC | PRN

## 2012-10-08 MED ORDER — ACETAMINOPHEN 325 MG PO TABS
650.0000 mg | ORAL_TABLET | Freq: Once | ORAL | Status: AC
  Administered 2012-10-08: 650 mg via ORAL
  Filled 2012-10-08: qty 2

## 2012-10-08 NOTE — ED Notes (Signed)
He noted upon awakening this morning that he had "real bad" low back pain, which persists.  He denies fever, trauma, nor any sign of recent illness.

## 2012-10-08 NOTE — ED Notes (Signed)
[id number]<TG> Expected date:<BR> Expected time:<BR> Means of arrival:<BR> Comments:<BR> EMS 61

## 2012-10-08 NOTE — ED Provider Notes (Signed)
History     CSN: [id number]  Arrival date & time 10/08/12  0725   First MD Initiated Contact with Patient 10/08/12 [phone]      Chief Complaint  Patient presents with  . Back Pain     HPI Patient awakened this morning with significant pain in his upper back.  Pain persists in radiate somewhat to the right arm with numbness.  No history syndromic.  Has had neck and back pain before.  Not as significant as this.  Has history of CAD.  Does have stent.  Patient denies chest pain. Past Medical History  Diagnosis Date  . Hypertension   . Bronchitis   . Arthritis   . Pneumonia     hx of  . CAD (coronary artery disease)     stent RCA 2010-[name]    Past Surgical History  Procedure Laterality Date  . Coronary stent placement    . Hernia repair      x 2    History reviewed. No pertinent family history.  History  Substance Use Topics  . Smoking status: Never Smoker   . Smokeless tobacco: Former Neurosurgeon    Types: Snuff, Chew  . Alcohol Use: No      Review of Systems All other systems reviewed and are negative  Allergies  Penicillins  Home Medications   Current Outpatient Rx  Name  Route  Sig  Dispense  Refill  . amLODipine (NORVASC) 5 MG tablet   Oral   Take 2 tablets (10 mg total) by mouth daily.   60 tablet   3   . ARTIFICIAL TEAR OP   Ophthalmic   Apply 1 drop to eye daily as needed. For dry eyes         . Besifloxacin HCl 0.6 % SUSP   Right Eye   Place 1 drop into the right eye 2 (two) times daily.         Marland Kitchen dexlansoprazole (DEXILANT) 60 MG capsule   Oral   Take 60 mg by mouth daily with breakfast.         . Difluprednate (DUREZOL) 0.05 % EMUL   Right Eye   Place 1 drop into the right eye 2 (two) times daily.         . ferrous sulfate 325 (65 FE) MG tablet   Oral   Take 325 mg by mouth daily with breakfast.          . finasteride (PROSCAR) 5 MG tablet   Oral   Take 5 mg by mouth daily.          Marland Kitchen HYDROcodone-acetaminophen  (NORCO/VICODIN) 5-325 MG per tablet   Oral   Take 1 tablet by mouth every 4 (four) hours as needed for pain.   30 tablet   2   . loperamide (IMODIUM A-D) 2 MG tablet   Oral   Take 1 tablet (2 mg total) by mouth 4 (four) times daily as needed for diarrhea or loose stools.   30 tablet   0   . metoprolol (TOPROL-XL) 50 MG 24 hr tablet   Oral   Take 50 mg by mouth daily.          . Nepafenac 0.3 % SUSP   Right Eye   Place 1 drop into the right eye daily.         . rosuvastatin (CRESTOR) 20 MG tablet   Oral   Take 20 mg by mouth every morning.         Marland Kitchen  silodosin (RAPAFLO) 4 MG CAPS capsule   Oral   Take 8 mg by mouth daily with breakfast.          . triamcinolone cream (KENALOG) 0.1 %   Topical   Apply 1 application topically 2 (two) times daily. To eczema           BP 175/63  Pulse 58  Temp(Src) 98.3 F (36.8 C) (Oral)  Resp 19  SpO2 96%  Physical Exam  Nursing note and vitals reviewed. Constitutional: He is oriented to person, place, and time. He appears well-developed and well-nourished. No distress.  HENT:  Head: Normocephalic and atraumatic.  Eyes: Pupils are equal, round, and reactive to light.  Neck: Normal range of motion.  Cardiovascular: Normal rate and intact distal pulses.   Murmur heard.  Systolic murmur is present with a grade of 1/6  Pulmonary/Chest: No respiratory distress.  Abdominal: Normal appearance. He exhibits no distension.  Musculoskeletal: Normal range of motion.       Back:  Neurological: He is alert and oriented to person, place, and time. No cranial nerve deficit.  Skin: Skin is warm and dry. No rash noted.  Psychiatric: He has a normal mood and affect. His behavior is normal.    ED Course  Procedures (including critical care time)  Date: 10/08/2012  Rate: 57  Rhythm: Junctional escape rhythm  QRS Axis: normal  Intervals: normal  ST/T Wave abnormalities: normal  Conduction Disutrbances: none  Narrative  Interpretation: Abnormal EKG  Review of cervical spine CT scan from 2012 shows the following: Foramen magnum is widely patent. C1-2 shows mild degenerative  change. C2-3 shows facet arthropathy on the right with foraminal  encroachment. C3-4 shows spondylosis and left-sided predominant  facet arthropathy with foraminal encroachment bilaterally. C4-5  shows spondylosis with foraminal encroachment bilaterally. C5-6,  C6-7 and C7-T1 show spondylosis with foraminal encroachment. There  are prominent anterior osteophytes throughout the region.    Labs Reviewed  POCT I-STAT, CHEM 8 - Abnormal; Notable for the following:    Creatinine, Ser 1.40 (*)    Hemoglobin 11.9 (*)    HCT 35.0 (*)    All other components within normal limits  POCT I-STAT TROPONIN I   Dg Chest 2 View  10/08/2012   *RADIOLOGY REPORT*  Clinical Data: Upper back pain  CHEST - 2 VIEW  Comparison: 06/20/2011  Findings: Normal cardiac silhouette.  The transverse aorta is dilated to 42 mm similar to prior.  No effusion, infiltrate, or pneumothorax. Degenerative osteophytosis of the thoracic spine.  IMPRESSION:  1.  No acute cardiopulmonary findings. 2.  Dilatation of the transverse aorta   Original Report Authenticated By: Genevive Bi, M.D.     1. DJD (degenerative joint disease) of cervical spine   2. Renal insufficiency       MDM  Patient feels better after Tylenol.  After review of his previous records and current lab I suspect his problem is secondary to severe cervical DJD.  Patient has marginal renal function therefore we'll avoid NSAIDs and start on tramadol.        Nelia Shi, MD 10/08/12 [phone]

## 2012-10-08 NOTE — ED Notes (Signed)
Pt calling for ride home 

## 2012-11-22 IMAGING — CR DG WRIST COMPLETE 3+V*L*
4 series · 4 of 4 positions shown · non-contrast
Comparison: None

CLINICAL DATA: Fall.  Pain.

LEFT WRIST - COMPLETE 3+ VIEW

[view not recorded (1 of 4)]
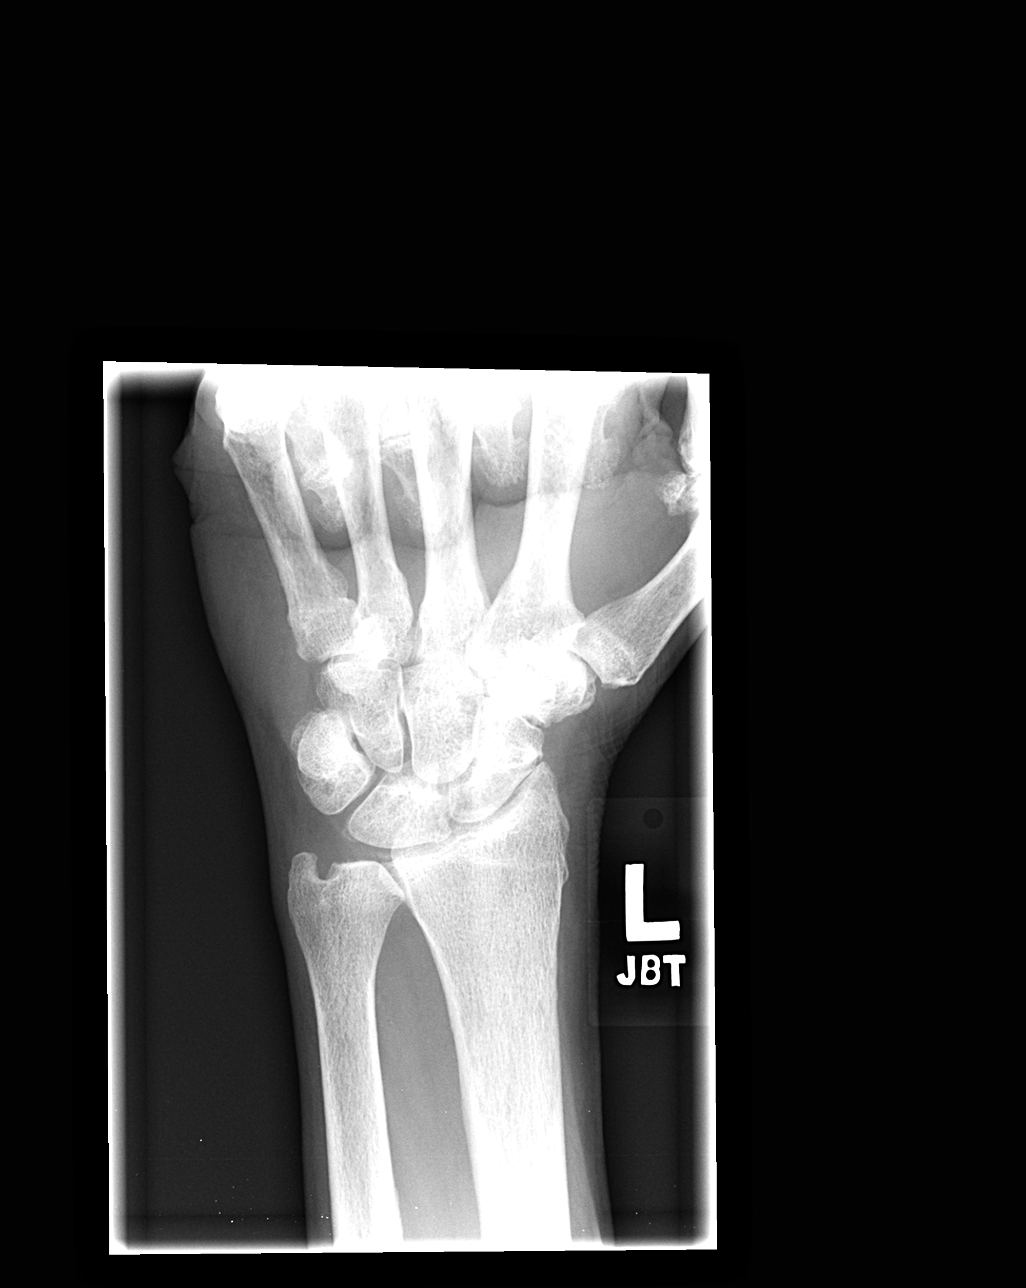

[view not recorded (2 of 4)]
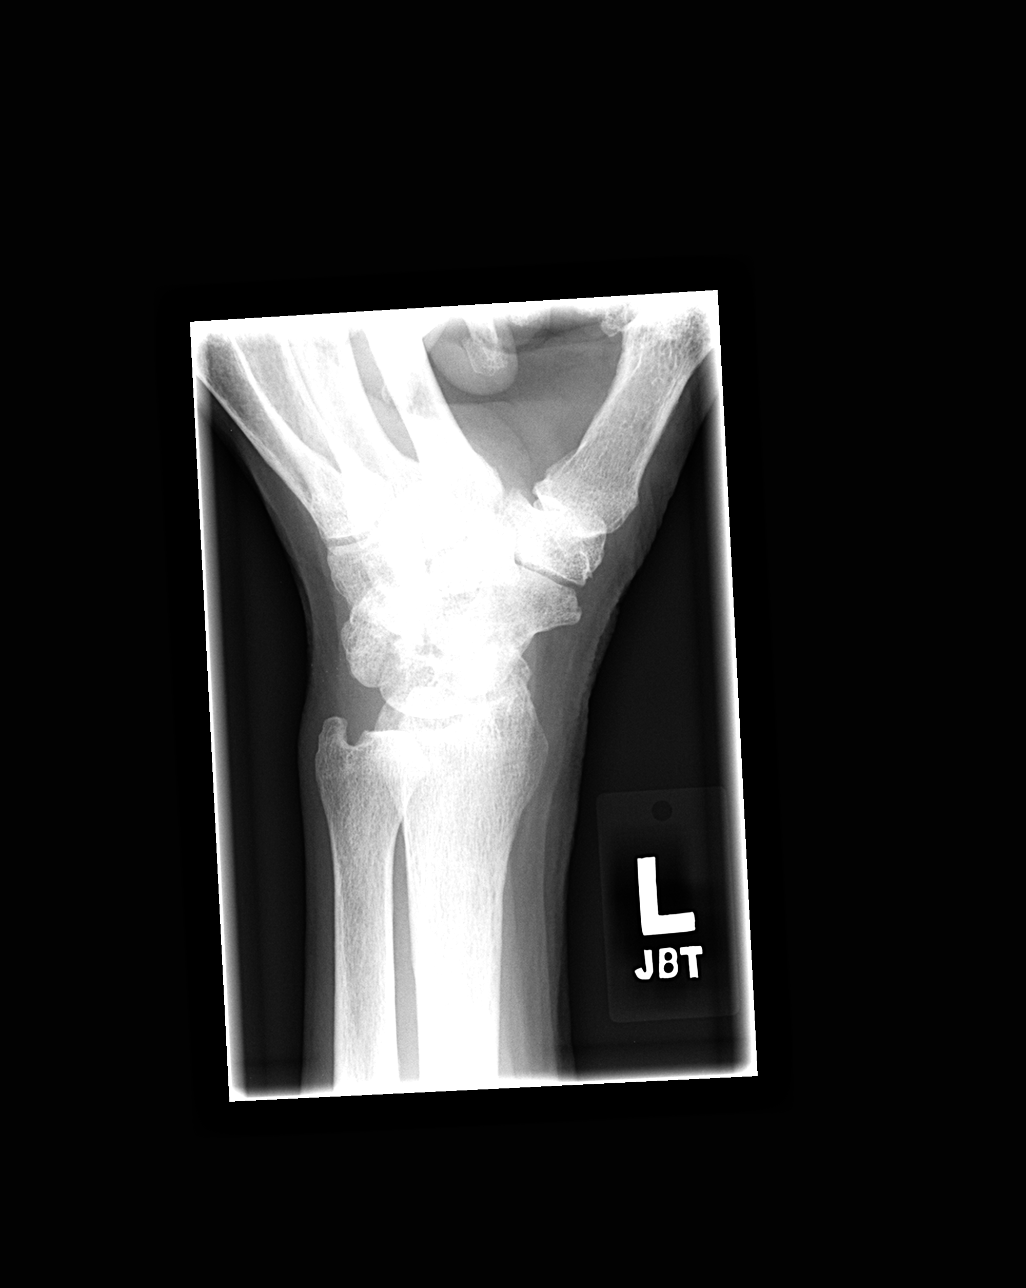

[view not recorded (3 of 4)]
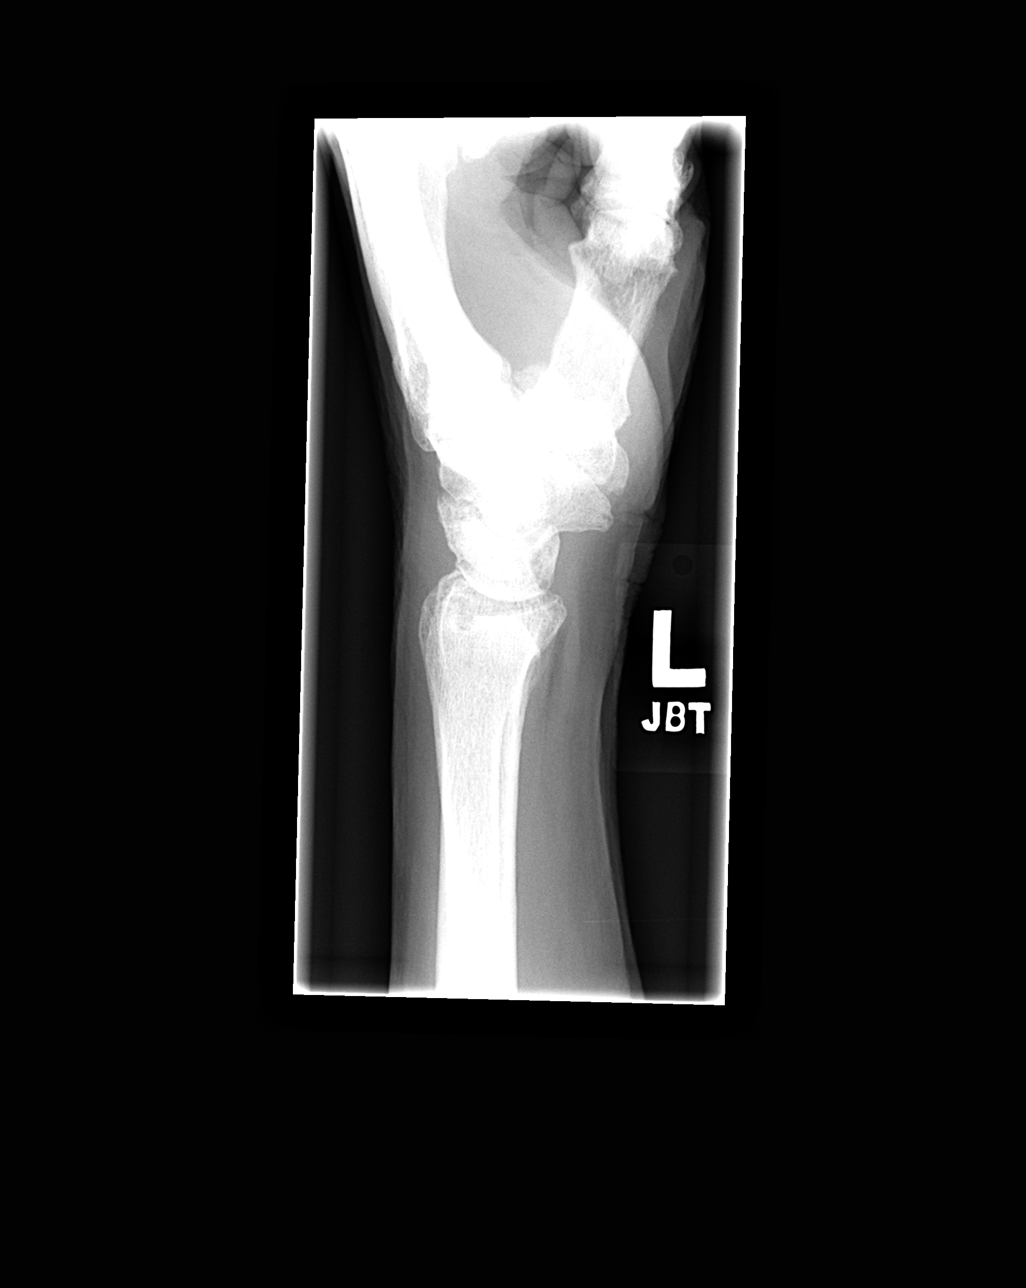

[view not recorded (4 of 4)]
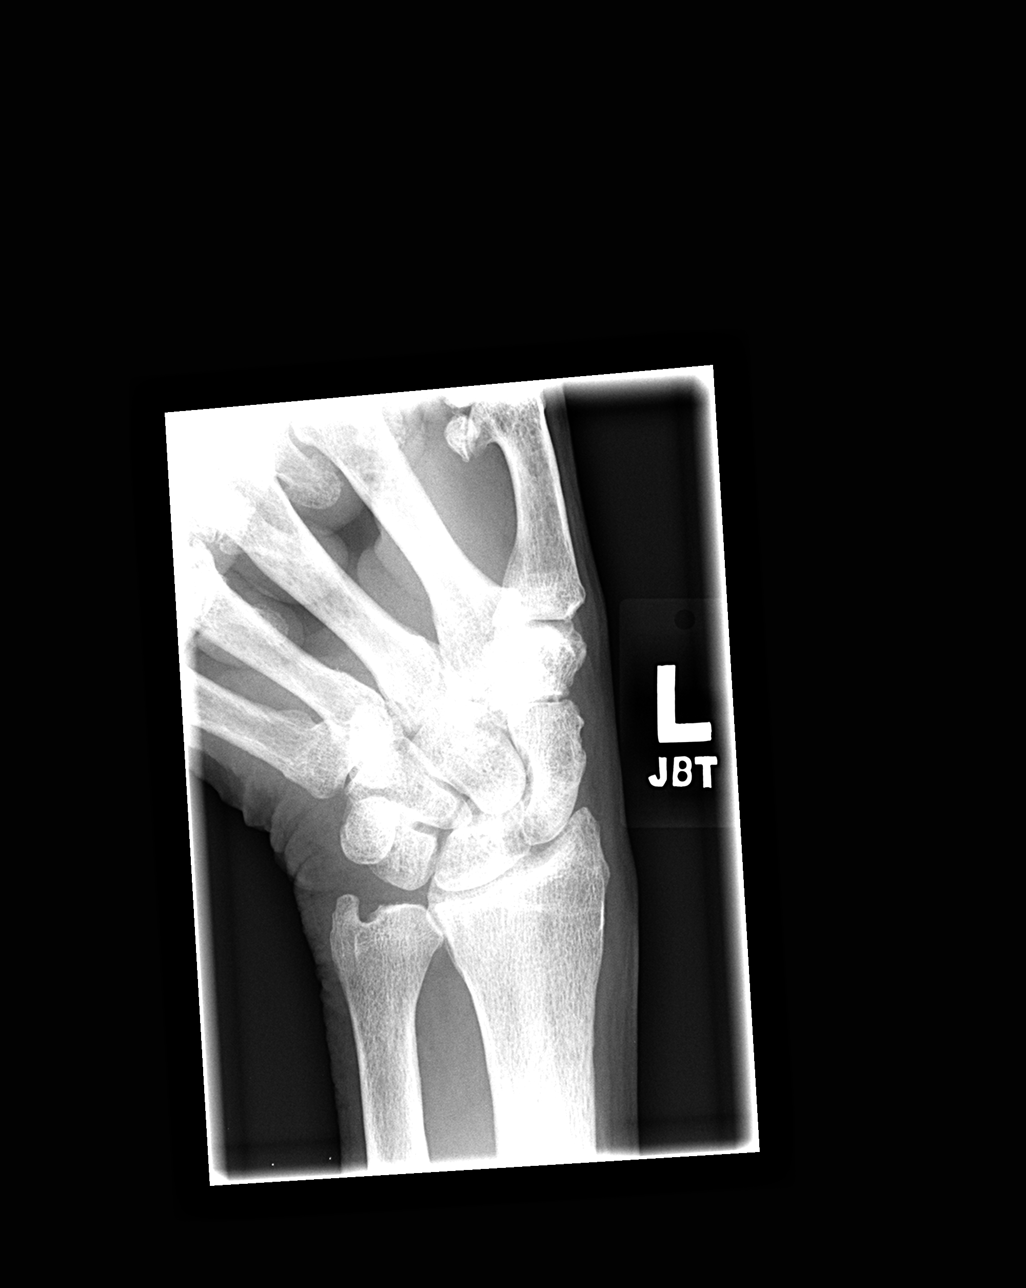

[4 of 4 positions shown; findings below may reference images not displayed]

FINDINGS: There are chronic degenerative changes of the wrist and
carpus but no sign of acute fracture or dislocation.
IMPRESSION: Chronic degenerative changes.  No acute or traumatic finding.

## 2012-11-22 IMAGING — CT CT HEAD W/O CM
5 of 9 series · 16 of 47 positions shown, 18 images · non-contrast
Comparison: None

CT HEAD

CLINICAL DATA: Fell.  Trauma to the head face and neck.

CT HEAD WITHOUT CONTRAST
CT MAXILLOFACIAL WITHOUT CONTRAST
CT CERVICAL SPINE WITHOUT CONTRAST
TECHNIQUE: Multidetector CT imaging of the head, cervical spine,
and maxillofacial structures were performed using the standard
protocol without intravenous contrast. Multiplanar CT image
reconstructions of the cervical spine and maxillofacial structures
were also generated.

[Series 4: head trauma 2.4 h60s · axial · 0.49mm/px · z∈[-141,-107]mm · 2 of 72 slices shown]
[im 15/72  brain]
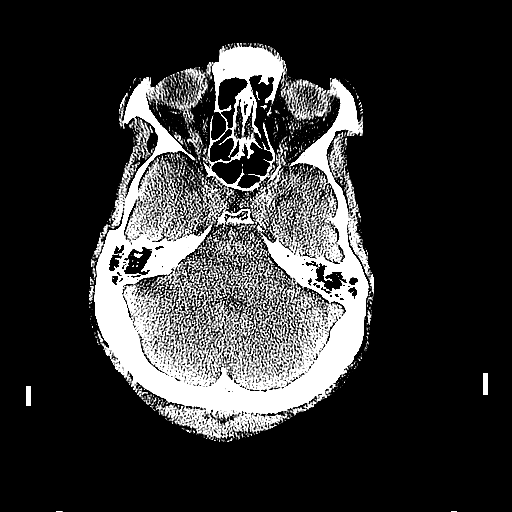
[im 29/72  brain]
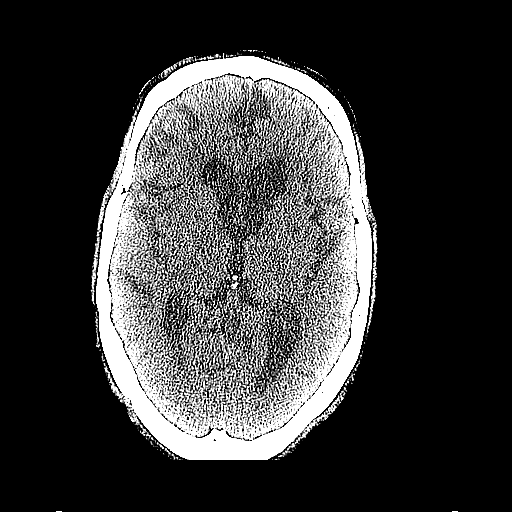

[Series 6: facial 2.0 h31s st · axial · 0.32mm/px · z∈[-260,-150]mm · 5 of 83 slices shown, 7 images]
[im 14/83  brain]
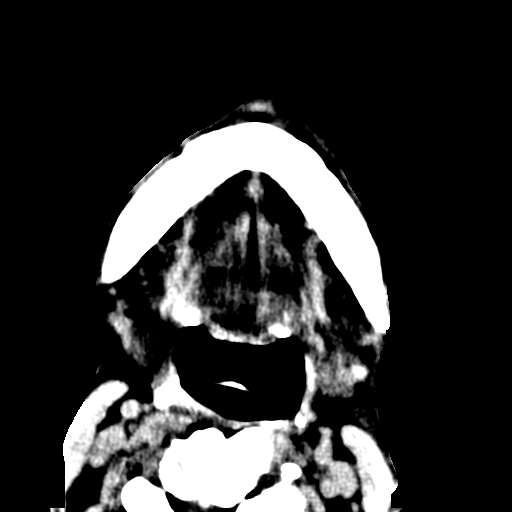
[im 14/83  bone]
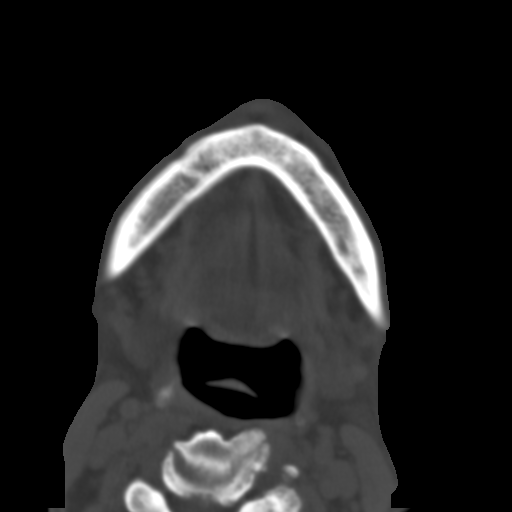
[im 28/83  brain]
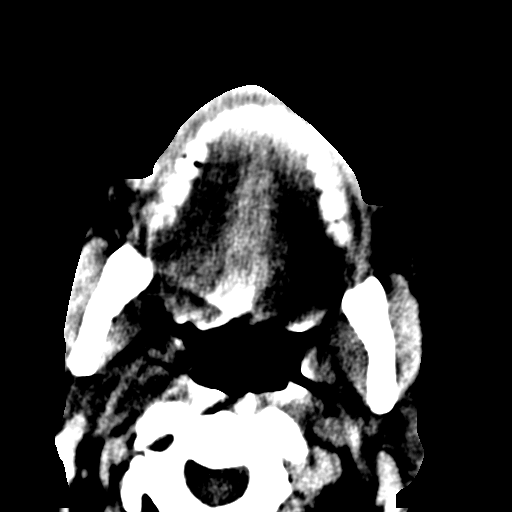
[im 42/83  brain]
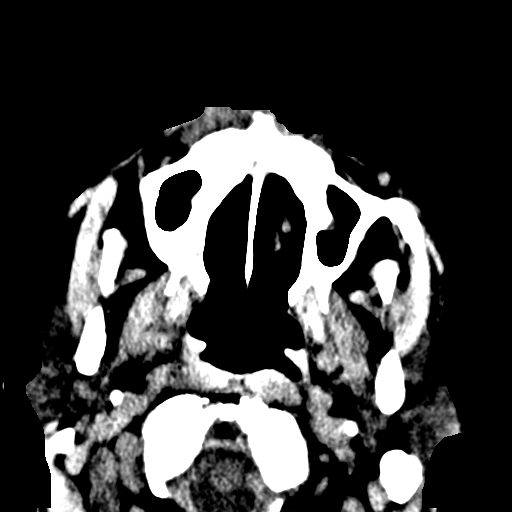
[im 55/83  brain]
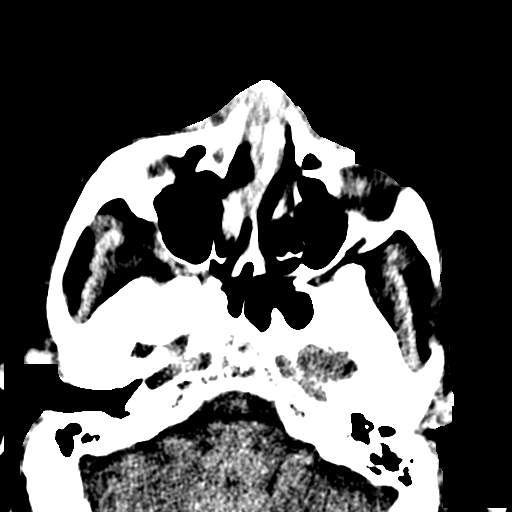
[im 69/83  brain]
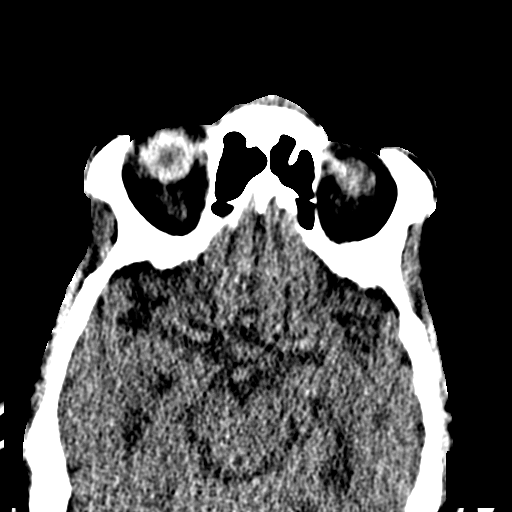
[im 69/83  bone]
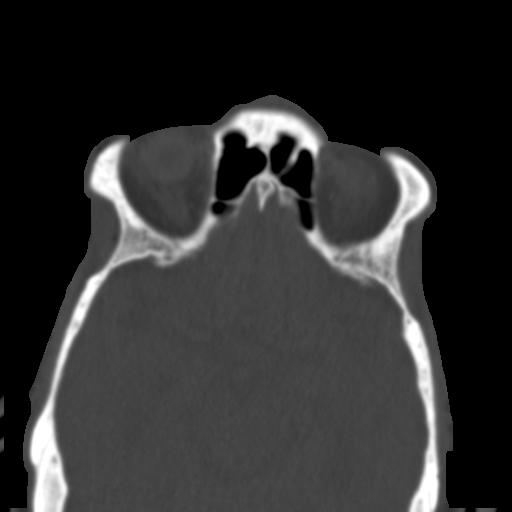

[Series 605: <mpr thick sag · sagittal · 0.32mm/px · 2 of 72 slices shown]
[im 24/72  brain]
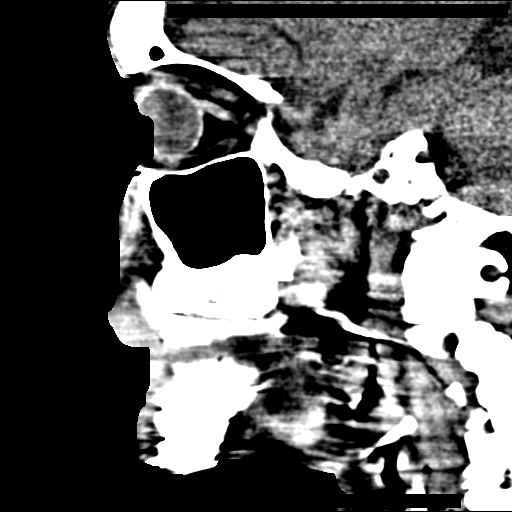
[im 48/72  brain]
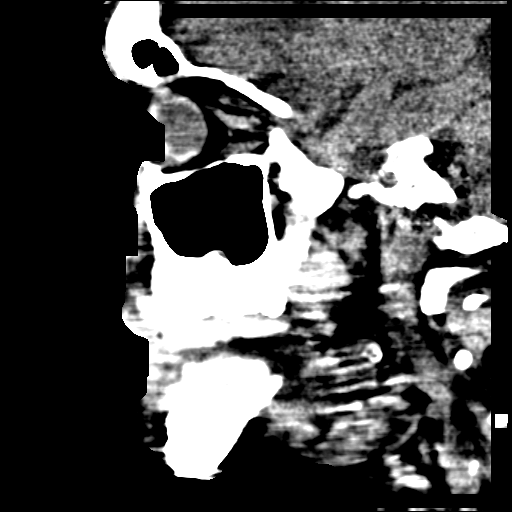

[Series 606: <mpr thick axial · axial · 0.28mm/px · z∈[-348,-238]mm · 5 of 89 slices shown]
[im 15/89  brain]
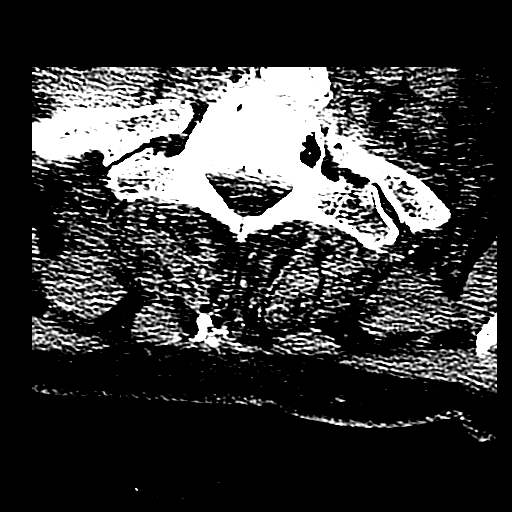
[im 30/89  brain]
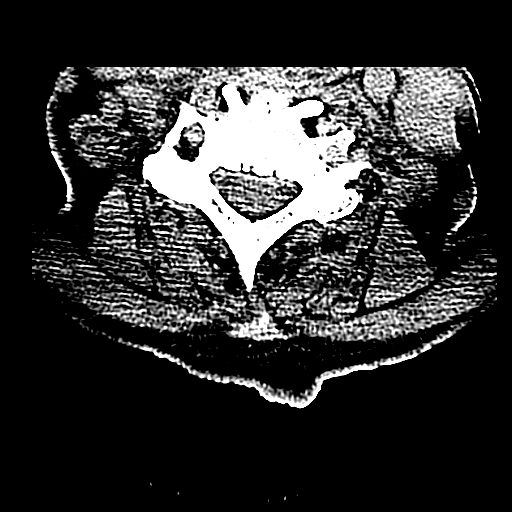
[im 45/89  brain]
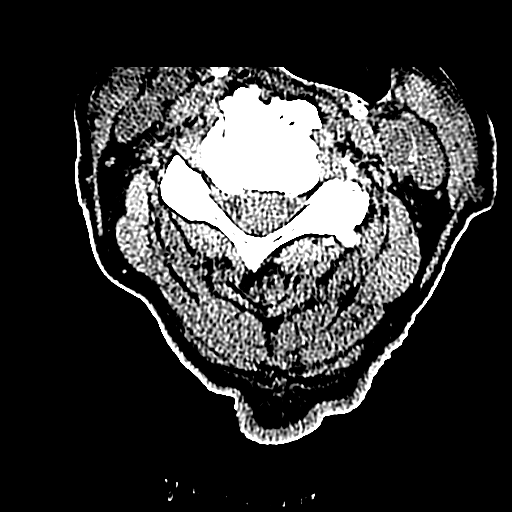
[im 59/89  brain]
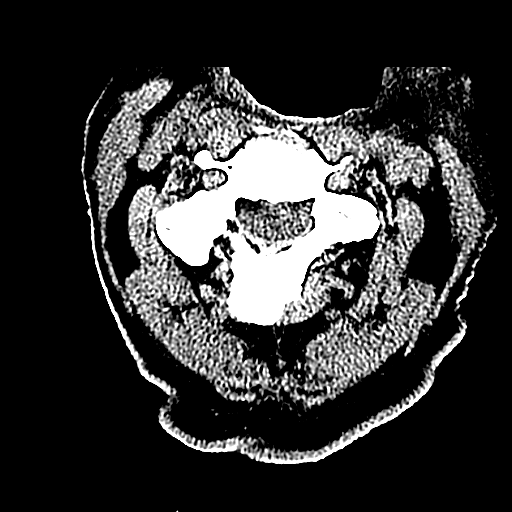
[im 74/89  brain]
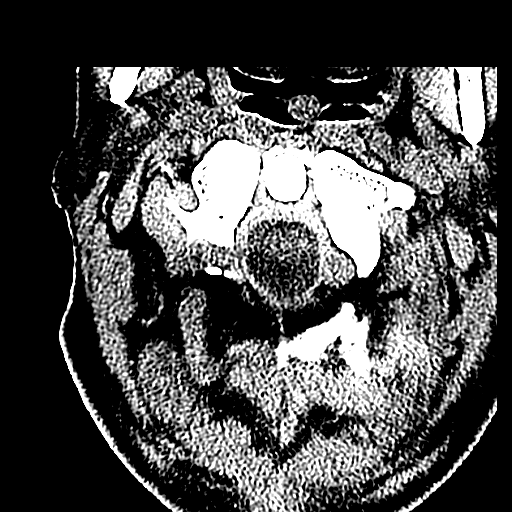

[Series 607: <mpr thick cor · coronal · 0.28mm/px · 2 of 39 slices shown]
[im 7/39  brain]
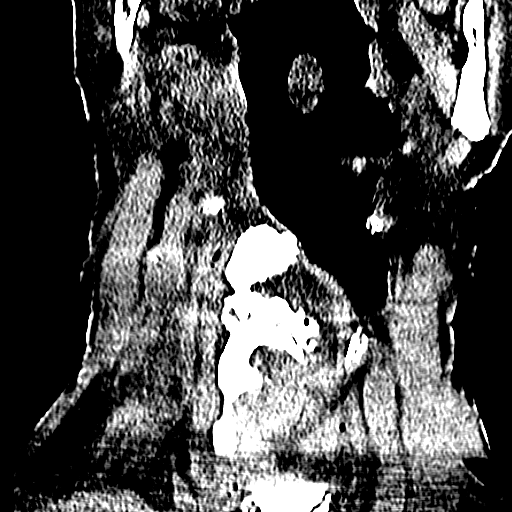
[im 23/39  brain]
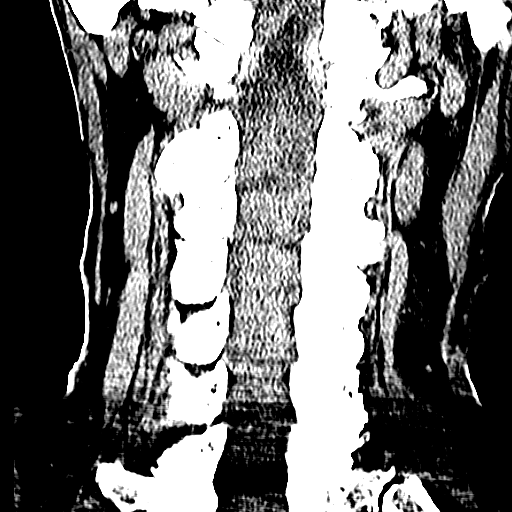

[16 of 47 positions shown; findings below may reference images not displayed]

FINDINGS: There is mild age related atrophy.  There are mild
chronic small vessel changes within the hemispheric deep white
matter. There are old lacunar infarctions within the left basal
ganglia.  No cortical or large vessel territory infarction. No mass
lesion, hemorrhage, hydrocephalus or extra-axial collection.  No
skull fracture.  Sinuses, middle ears and mastoids are clear.
IMPRESSION: Atrophy, chronic small vessel disease.  Old lacunar infarctions
left basal ganglia.  No acute or traumatic finding.

CT MAXILLOFACIAL
FINDINGS: There is soft tissue swelling of the nose but no
evidence of fracture of the nasal bones or other facial bones.  No
fluid in the sinuses, middle ears or mastoids.
IMPRESSION: No facial fracture.  Soft tissue swelling of the nose but no
fracture of the nasal bones.

CT CERVICAL SPINE
FINDINGS: No cervical spine fracture.  No traumatic malalignment.
No soft tissue swelling.

Foramen magnum is widely patent.  C1-2 shows mild degenerative
change.  C2-3 shows facet arthropathy on the right with foraminal
encroachment.  C3-4 shows spondylosis and left-sided predominant
facet arthropathy with foraminal encroachment bilaterally.  C4-5
shows spondylosis with foraminal encroachment bilaterally.  C5-6,
C6-7 and C7-T1 show spondylosis with foraminal encroachment.  There
are prominent anterior osteophytes throughout the region.
IMPRESSION: No acute or traumatic finding.  Chronic spondylosis and facet
arthropathy as outlined above.

## 2013-02-11 ENCOUNTER — Encounter (HOSPITAL_COMMUNITY): Payer: Self-pay | Admitting: Emergency Medicine

## 2013-02-11 ENCOUNTER — Emergency Department (INDEPENDENT_AMBULATORY_CARE_PROVIDER_SITE_OTHER)
Admission: EM | Admit: 2013-02-11 | Discharge: 2013-02-11 | Disposition: A | Discharge status: Home / Self Care | Payer: Medicare Other | Source: Home / Self Care | Attending: Family Medicine | Admitting: Family Medicine

## 2013-02-11 DIAGNOSIS — J329 Chronic sinusitis, unspecified: Secondary | ICD-10-CM

## 2013-02-11 MED ORDER — FLUTICASONE PROPIONATE 50 MCG/ACT NA SUSP: 2.0000 | Freq: Every day | NASAL | Status: AC

## 2013-02-11 NOTE — ED Provider Notes (Signed)
Medical screening examination/treatment/procedure(s) were performed by a resident physician or non-physician practitioner and as the supervising physician I was immediately available for consultation/collaboration.  Evan Corey, MD    Evan S Corey, MD 02/11/13 1911 

## 2013-02-11 NOTE — ED Notes (Signed)
Pt  Reports        Symptoms  Of  Cough      Congested        And  Body aches    -    Symptoms  For  About  1  Week          Pt is  Awake and  Alert  And  Oriented   Appears  In  No  Severe  Distress

## 2013-02-11 NOTE — ED Provider Notes (Signed)
CSN: [id number]     Arrival date & time 02/11/13  1429 History   First MD Initiated Contact with Patient 02/11/13 1623     Chief Complaint  Patient presents with  . Cough   (Consider location/radiation/quality/duration/timing/severity/associated sxs/prior Treatment) HPI  Cough for one week. Worse at night. Productive of clear phlegm. No difficulty breathing. No wheezes. No fever or chills. Has been taking OTC medication that is combination of acetaminophen, guaifenesin, and phenylephrine. No hx of pneumonia, hospitalization, or COPD.   Past Medical History  Diagnosis Date  . Hypertension   . Bronchitis   . Arthritis   . Pneumonia     hx of  . CAD (coronary artery disease)     stent RCA 2010-[name]   Past Surgical History  Procedure Laterality Date  . Coronary stent placement    . Hernia repair      x 2   History reviewed. No pertinent family history. History  Substance Use Topics  . Smoking status: Never Smoker   . Smokeless tobacco: Former Neurosurgeon    Types: Snuff, Chew  . Alcohol Use: No    Review of Systems See HPI Allergies  Penicillins  Home Medications   Current Outpatient Rx  Name  Route  Sig  Dispense  Refill  . amLODipine (NORVASC) 5 MG tablet   Oral   Take 2 tablets (10 mg total) by mouth daily.   60 tablet   3   . ARTIFICIAL TEAR OP   Ophthalmic   Apply 1 drop to eye daily as needed. For dry eyes         . Besifloxacin HCl 0.6 % SUSP   Right Eye   Place 1 drop into the right eye 2 (two) times daily.         Marland Kitchen dexlansoprazole (DEXILANT) 60 MG capsule   Oral   Take 60 mg by mouth daily with breakfast.         . Difluprednate (DUREZOL) 0.05 % EMUL   Right Eye   Place 1 drop into the right eye 2 (two) times daily.         . ferrous sulfate 325 (65 FE) MG tablet   Oral   Take 325 mg by mouth daily with breakfast.          . finasteride (PROSCAR) 5 MG tablet   Oral   Take 5 mg by mouth daily.          Marland Kitchen  HYDROcodone-acetaminophen (NORCO/VICODIN) 5-325 MG per tablet   Oral   Take 1 tablet by mouth every 4 (four) hours as needed for pain.   30 tablet   2   . loperamide (IMODIUM A-D) 2 MG tablet   Oral   Take 1 tablet (2 mg total) by mouth 4 (four) times daily as needed for diarrhea or loose stools.   30 tablet   0   . metoprolol (TOPROL-XL) 50 MG 24 hr tablet   Oral   Take 50 mg by mouth daily.          . Nepafenac 0.3 % SUSP   Right Eye   Place 1 drop into the right eye daily.         . rosuvastatin (CRESTOR) 20 MG tablet   Oral   Take 20 mg by mouth every morning.         . silodosin (RAPAFLO) 4 MG CAPS capsule   Oral   Take 8 mg by mouth daily with breakfast.          .  triamcinolone cream (KENALOG) 0.1 %   Topical   Apply 1 application topically 2 (two) times daily. To eczema          BP 156/74  Pulse 58  Temp(Src) 98.4 F (36.9 C) (Oral)  Resp 18  SpO2 100% Physical Exam  Constitutional: He is oriented to person, place, and time.  Elderly, but well appearing for his age  HENT:  Head: Normocephalic and atraumatic.  Right Ear: External ear normal.  Left Ear: External ear normal.  Nose: Nose normal.  Mouth/Throat: Oropharynx is clear and moist. No oropharyngeal exudate.  Eyes: Conjunctivae and EOM are normal. Pupils are equal, round, and reactive to light.  Neck: Normal range of motion. Neck supple.  Cardiovascular: Normal rate and regular rhythm.   Murmur heard. Pulmonary/Chest: Effort normal and breath sounds normal. No respiratory distress. He has no wheezes. He has no rales. He exhibits no tenderness.  Lymphadenopathy:    He has no cervical adenopathy.  Neurological: He is alert and oriented to person, place, and time.  Skin: Skin is warm and dry.  Psychiatric: He has a normal mood and affect. His behavior is normal.    ED Course  Procedures (including critical care time) Labs Review Labs Reviewed - No data to display Imaging Review No  results found.    MDM   1. Sinusitis    No hx or clinical evidence of pnuemonia, so no imaging performed. Likely with viral sinusitis, but symptoms also consistent with allergic rhinitis. Therefore, will trial fluticasone nasal spray. The patient was given strict precautions to follow for chest pain or shortness of breath or difficulty breathing.    Garnetta Buddy, MD 02/11/13 [phone]

## 2014-01-03 ENCOUNTER — Encounter (HOSPITAL_COMMUNITY): Payer: Self-pay | Admitting: Emergency Medicine

## 2014-01-03 ENCOUNTER — Emergency Department (HOSPITAL_COMMUNITY): Payer: Medicare Other

## 2014-01-03 ENCOUNTER — Observation Stay (HOSPITAL_COMMUNITY)
Admission: EM | Admit: 2014-01-03 | Discharge: 2014-01-07 | Disposition: A | Discharge status: Home / Self Care | Payer: Medicare Other | Attending: Internal Medicine | Admitting: Internal Medicine

## 2014-01-03 DIAGNOSIS — M129 Arthropathy, unspecified: Secondary | ICD-10-CM | POA: Diagnosis not present

## 2014-01-03 DIAGNOSIS — E785 Hyperlipidemia, unspecified: Secondary | ICD-10-CM | POA: Diagnosis not present

## 2014-01-03 DIAGNOSIS — Z8709 Personal history of other diseases of the respiratory system: Secondary | ICD-10-CM | POA: Insufficient documentation

## 2014-01-03 DIAGNOSIS — Z8701 Personal history of pneumonia (recurrent): Secondary | ICD-10-CM | POA: Insufficient documentation

## 2014-01-03 DIAGNOSIS — D62 Acute posthemorrhagic anemia: Secondary | ICD-10-CM

## 2014-01-03 DIAGNOSIS — M545 Low back pain, unspecified: Secondary | ICD-10-CM | POA: Diagnosis not present

## 2014-01-03 DIAGNOSIS — I2510 Atherosclerotic heart disease of native coronary artery without angina pectoris: Secondary | ICD-10-CM | POA: Diagnosis not present

## 2014-01-03 DIAGNOSIS — D649 Anemia, unspecified: Secondary | ICD-10-CM

## 2014-01-03 DIAGNOSIS — D638 Anemia in other chronic diseases classified elsewhere: Secondary | ICD-10-CM | POA: Diagnosis not present

## 2014-01-03 DIAGNOSIS — Z9861 Coronary angioplasty status: Secondary | ICD-10-CM | POA: Diagnosis not present

## 2014-01-03 DIAGNOSIS — M5459 Other low back pain: Secondary | ICD-10-CM

## 2014-01-03 DIAGNOSIS — Z880 Allergy status to penicillin: Secondary | ICD-10-CM | POA: Diagnosis not present

## 2014-01-03 DIAGNOSIS — K5733 Diverticulitis of large intestine without perforation or abscess with bleeding: Secondary | ICD-10-CM

## 2014-01-03 DIAGNOSIS — I10 Essential (primary) hypertension: Secondary | ICD-10-CM | POA: Diagnosis not present

## 2014-01-03 DIAGNOSIS — R1032 Left lower quadrant pain: Secondary | ICD-10-CM

## 2014-01-03 DIAGNOSIS — I350 Nonrheumatic aortic (valve) stenosis: Secondary | ICD-10-CM

## 2014-01-03 DIAGNOSIS — R109 Unspecified abdominal pain: Secondary | ICD-10-CM | POA: Insufficient documentation

## 2014-01-03 DIAGNOSIS — IMO0002 Reserved for concepts with insufficient information to code with codable children: Secondary | ICD-10-CM | POA: Insufficient documentation

## 2014-01-03 DIAGNOSIS — Z79899 Other long term (current) drug therapy: Secondary | ICD-10-CM | POA: Insufficient documentation

## 2014-01-03 LAB — COMPREHENSIVE METABOLIC PANEL
ALBUMIN: 3.7 g/dL (ref 3.5–5.2)
ALT: 14 U/L (ref 0–53)
AST: 32 U/L (ref 0–37)
Alkaline Phosphatase: 66 U/L (ref 39–117)
Anion gap: 13 (ref 5–15)
BUN: 19 mg/dL (ref 6–23)
CALCIUM: 9.5 mg/dL (ref 8.4–10.5)
CHLORIDE: 99 meq/L (ref 96–112)
CO2: 22 meq/L (ref 19–32)
CREATININE: 1.32 mg/dL (ref 0.50–1.35)
GFR calc Af Amer: 50 mL/min — ABNORMAL LOW (ref >90)
GFR calc non Af Amer: 43 mL/min — ABNORMAL LOW (ref >90)
Glucose, Bld: 88 mg/dL (ref 70–99)
Potassium: 5.1 mEq/L (ref 3.7–5.3)
SODIUM: 134 meq/L — AB (ref 137–147)
Total Bilirubin: 0.3 mg/dL (ref 0.3–1.2)
Total Protein: 8.5 g/dL — ABNORMAL HIGH (ref 6.0–8.3)

## 2014-01-03 LAB — CBC WITH DIFFERENTIAL/PLATELET
BASOS ABS: 0 10*3/uL (ref 0.0–0.1)
BASOS PCT: 0 % (ref 0–1)
EOS PCT: 1 % (ref 0–5)
Eosinophils Absolute: 0.1 10*3/uL (ref 0.0–0.7)
HEMATOCRIT: 29.2 % — AB (ref 39.0–52.0)
Hemoglobin: 9.5 g/dL — ABNORMAL LOW (ref 13.0–17.0)
LYMPHS PCT: 12 % (ref 12–46)
Lymphs Abs: 0.7 10*3/uL (ref 0.7–4.0)
MCH: 29.2 pg (ref 26.0–34.0)
MCHC: 32.5 g/dL (ref 30.0–36.0)
MCV: 89.8 fL (ref 78.0–100.0)
MONO ABS: 1.4 10*3/uL — AB (ref 0.1–1.0)
Monocytes Relative: 25 % — ABNORMAL HIGH (ref 3–12)
NEUTROS ABS: 3.5 10*3/uL (ref 1.7–7.7)
Neutrophils Relative %: 62 % (ref 43–77)
PLATELETS: 171 10*3/uL (ref 150–400)
RBC: 3.25 MIL/uL — ABNORMAL LOW (ref 4.22–5.81)
RDW: 15.5 % (ref 11.5–15.5)
WBC: 5.7 10*3/uL (ref 4.0–10.5)

## 2014-01-03 LAB — URINALYSIS, ROUTINE W REFLEX MICROSCOPIC
Bilirubin Urine: NEGATIVE
GLUCOSE, UA: NEGATIVE mg/dL
Ketones, ur: NEGATIVE mg/dL
LEUKOCYTES UA: NEGATIVE
Nitrite: NEGATIVE
PH: 5 (ref 5.0–8.0)
Protein, ur: 100 mg/dL — AB
Specific Gravity, Urine: 1.015 (ref 1.005–1.030)
Urobilinogen, UA: 0.2 mg/dL (ref 0.0–1.0)

## 2014-01-03 LAB — I-STAT TROPONIN, ED: Troponin i, poc: 0.03 ng/mL (ref 0.00–0.08)

## 2014-01-03 LAB — URINE MICROSCOPIC-ADD ON

## 2014-01-03 LAB — I-STAT CG4 LACTIC ACID, ED: LACTIC ACID, VENOUS: 0.73 mmol/L (ref 0.5–2.2)

## 2014-01-03 MED ORDER — FENTANYL CITRATE 0.05 MG/ML IJ SOLN
25.0000 ug | Freq: Once | INTRAMUSCULAR | Status: AC
  Administered 2014-01-04: 25 ug via INTRAVENOUS
  Filled 2014-01-03: qty 2

## 2014-01-03 MED ORDER — FENTANYL CITRATE 0.05 MG/ML IJ SOLN
25.0000 ug | Freq: Once | INTRAMUSCULAR | Status: AC
  Administered 2014-01-03: 25 ug via INTRAVENOUS
  Filled 2014-01-03: qty 2

## 2014-01-03 NOTE — ED Notes (Signed)
Per EMS pt comes from home, Pt was sitting on bed could not get up states lower back sides were hurting c/o dysuria, frequent urination, burning with urination which started yesterday. Symptoms have gotten worse today. Pt is alert and oriented usually able to walk. Pt leans to the left which is baseline.

## 2014-01-03 NOTE — ED Provider Notes (Signed)
CSN: [id number]     Arrival date & time 01/03/14  1953 History   First MD Initiated Contact with Patient 01/03/14 2012     Chief Complaint  Patient presents with  . Flank Pain     (Consider location/radiation/quality/duration/timing/severity/associated sxs/prior Treatment) HPI 80 year old male lives at home with family states he has chronic back pain for years chronic generalized weakness with chronic generalized pain to his entire body for years 24 hours a day states all the symptoms are gradually worsening the last couple weeks he also has chronic dysuria with chronic urinary frequency for years it is stable with no falls; uses a walker; denies headache neck pain chest pain cough shortness breath abdominal pain vomiting diarrhea focal weakness or numbness or other concerns.  Lungs clear abdomen soft nontender baseline diffuse lumbar tenderness no edema DP pulses intact minimal tenderness to all 4 extremities moves all 4 extremities well spontaneously Past Medical History  Diagnosis Date  . Hypertension   . Bronchitis   . Arthritis   . Pneumonia     hx of  . CAD (coronary artery disease)     stent RCA 2010-[name]   Past Surgical History  Procedure Laterality Date  . Coronary stent placement    . Hernia repair      x 2   History reviewed. No pertinent family history. History  Substance Use Topics  . Smoking status: Never Smoker   . Smokeless tobacco: Former Neurosurgeon    Types: Snuff, Chew  . Alcohol Use: No    Review of Systems 10 Systems reviewed and are negative for acute change except as noted in the HPI.   Allergies  Penicillins  Home Medications   Prior to Admission medications   Medication Sig Start Date End Date Taking? Authorizing Provider  amLODipine (NORVASC) 5 MG tablet Take 2 tablets (10 mg total) by mouth daily. 06/19/12  Yes Ripudeep Jenna Luo, MD  ARTIFICIAL TEAR OP Apply 1 drop to eye daily as needed. For dry eyes   Yes Historical Provider, MD   dexlansoprazole (DEXILANT) 60 MG capsule Take 60 mg by mouth daily with breakfast.   Yes Historical Provider, MD  finasteride (PROSCAR) 5 MG tablet Take 5 mg by mouth daily.    Yes Historical Provider, MD  fluticasone (FLONASE) 50 MCG/ACT nasal spray Place 2 sprays into the nose daily. 02/11/13  Yes Garnetta Buddy, MD  loperamide (IMODIUM A-D) 2 MG tablet Take 1 tablet (2 mg total) by mouth 4 (four) times daily as needed for diarrhea or loose stools. 06/19/12  Yes Ripudeep Jenna Luo, MD  metoprolol (TOPROL-XL) 50 MG 24 hr tablet Take 50 mg by mouth daily.    Yes Historical Provider, MD  rosuvastatin (CRESTOR) 20 MG tablet Take 20 mg by mouth every morning.   Yes Historical Provider, MD  triamcinolone cream (KENALOG) 0.1 % Apply 1 application topically 2 (two) times daily. To eczema   Yes Historical Provider, MD  acetaminophen (TYLENOL) 325 MG tablet Take 2 tablets (650 mg total) by mouth every 6 (six) hours as needed for mild pain (or Fever >/= 101). 01/07/14   Alison Murray, MD  oxyCODONE (OXY IR/ROXICODONE) 5 MG immediate release tablet Take one tablet by mouth every 4 hours as needed for mild to moderate pain; Take two tablets by mouth every 4 hours as needed for moderate to severe pain 01/10/14   Sharon Seller, NP   BP 118/58  Pulse 68  Temp(Src) 97.8 F (36.6 C) (Oral)  Resp 16  Ht 6' (1.829 m)  Wt 143 lb 15.4 oz (65.3 kg)  BMI 19.52 kg/m2  SpO2 96% Physical Exam  Nursing note and vitals reviewed. Constitutional:  Awake, alert, nontoxic appearance.  HENT:  Head: Atraumatic.  Eyes: Right eye exhibits no discharge. Left eye exhibits no discharge.  Neck: Neck supple.  Cardiovascular: Normal rate and regular rhythm.   No murmur heard. Pulmonary/Chest: Effort normal and breath sounds normal. No respiratory distress. He has no wheezes. He has no rales. He exhibits no tenderness.  RA sat normal 93%  Abdominal: Soft. Bowel sounds are normal. He exhibits no distension and no mass. There  is no tenderness. There is no rebound and no guarding.  Musculoskeletal:  Baseline ROM, no obvious new focal weakness.baseline diffuse lumbar tenderness no edema DP pulses intact minimal tenderness to all 4 extremities moves all 4 extremities well spontaneously   Neurological: He is alert.  Mental status and motor strength appears baseline for patient and situation.  Skin: No rash noted.  Psychiatric: He has a normal mood and affect.    ED Course  Procedures (including critical care time) Too much LBP to sit up or walk for discharge so request [facility] paged. [phone] Patient / Family / Caregiver informed of clinical course, understand medical decision-making process, and agree with plan. Labs Review Labs Reviewed  CBC WITH DIFFERENTIAL - Abnormal; Notable for the following:    RBC 3.25 (*)    Hemoglobin 9.5 (*)    HCT 29.2 (*)    Monocytes Relative 25 (*)    Monocytes Absolute 1.4 (*)    All other components within normal limits  COMPREHENSIVE METABOLIC PANEL - Abnormal; Notable for the following:    Sodium 134 (*)    Total Protein 8.5 (*)    GFR calc non Af Amer 43 (*)    GFR calc Af Amer 50 (*)    All other components within normal limits  URINALYSIS, ROUTINE W REFLEX MICROSCOPIC - Abnormal; Notable for the following:    Hgb urine dipstick TRACE (*)    Protein, ur 100 (*)    All other components within normal limits  COMPREHENSIVE METABOLIC PANEL - Abnormal; Notable for the following:    Sodium 134 (*)    Glucose, Bld 112 (*)    Albumin 3.1 (*)    Total Bilirubin 0.2 (*)    GFR calc non Af Amer 45 (*)    GFR calc Af Amer 52 (*)    All other components within normal limits  CBC WITH DIFFERENTIAL - Abnormal; Notable for the following:    RBC 3.09 (*)    Hemoglobin 8.8 (*)    HCT 28.0 (*)    Platelets 144 (*)    Lymphs Abs 0.6 (*)    Monocytes Relative 29 (*)    Monocytes Absolute 1.2 (*)    All other components within normal limits  CREATININE, SERUM - Abnormal;  Notable for the following:    GFR calc non Af Amer 42 (*)    GFR calc Af Amer 49 (*)    All other components within normal limits  URINE MICROSCOPIC-ADD ON  I-STAT CG4 LACTIC ACID, ED  I-STAT TROPOININ, ED    Imaging Review No results found.   EKG Interpretation   Date/Time:  Thursday January 03 2014 21:03:03 EDT Ventricular Rate:  73 PR Interval:  187 QRS Duration: 99 QT Interval:  419 QTC Calculation: 462 R Axis:   -37 Text Interpretation:  Sinus rhythm  Left ventricular hypertrophy Anterior Q  waves, possibly due to LVH ED PHYSICIAN INTERPRETATION AVAILABLE IN [facility] Confirmed by TEST, Record ([id number]) on 01/05/2014 8:49:28 AM      MDM   Final diagnoses:  Intractable low back pain    The patient appears reasonably stabilized for admission considering the current resources, flow, and capabilities available in the ED at this time, and I doubt any other [facility] requiring further screening and/or treatment in the ED prior to admission.    Hurman Horn, MD 01/16/14 1420

## 2014-01-03 NOTE — ED Notes (Signed)
Bed: VH84 Expected date:  Expected time:  Means of arrival:  Comments: EMS/80 yo with bilateral flank pain

## 2014-01-04 ENCOUNTER — Observation Stay (HOSPITAL_COMMUNITY): Payer: Medicare Other

## 2014-01-04 ENCOUNTER — Encounter (HOSPITAL_COMMUNITY): Payer: Self-pay | Admitting: Internal Medicine

## 2014-01-04 DIAGNOSIS — Z9861 Coronary angioplasty status: Secondary | ICD-10-CM

## 2014-01-04 DIAGNOSIS — M545 Low back pain, unspecified: Secondary | ICD-10-CM | POA: Diagnosis not present

## 2014-01-04 DIAGNOSIS — M5459 Other low back pain: Secondary | ICD-10-CM | POA: Diagnosis present

## 2014-01-04 DIAGNOSIS — E785 Hyperlipidemia, unspecified: Secondary | ICD-10-CM | POA: Diagnosis present

## 2014-01-04 DIAGNOSIS — I2510 Atherosclerotic heart disease of native coronary artery without angina pectoris: Secondary | ICD-10-CM

## 2014-01-04 DIAGNOSIS — D649 Anemia, unspecified: Secondary | ICD-10-CM

## 2014-01-04 DIAGNOSIS — I359 Nonrheumatic aortic valve disorder, unspecified: Secondary | ICD-10-CM

## 2014-01-04 DIAGNOSIS — I10 Essential (primary) hypertension: Secondary | ICD-10-CM

## 2014-01-04 LAB — CBC WITH DIFFERENTIAL/PLATELET
BASOS PCT: 0 % (ref 0–1)
Basophils Absolute: 0 10*3/uL (ref 0.0–0.1)
EOS ABS: 0 10*3/uL (ref 0.0–0.7)
Eosinophils Relative: 1 % (ref 0–5)
HCT: 28 % — ABNORMAL LOW (ref 39.0–52.0)
Hemoglobin: 8.8 g/dL — ABNORMAL LOW (ref 13.0–17.0)
Lymphocytes Relative: 13 % (ref 12–46)
Lymphs Abs: 0.6 10*3/uL — ABNORMAL LOW (ref 0.7–4.0)
MCH: 28.5 pg (ref 26.0–34.0)
MCHC: 31.4 g/dL (ref 30.0–36.0)
MCV: 90.6 fL (ref 78.0–100.0)
MONOS PCT: 29 % — AB (ref 3–12)
Monocytes Absolute: 1.2 10*3/uL — ABNORMAL HIGH (ref 0.1–1.0)
NEUTROS PCT: 57 % (ref 43–77)
Neutro Abs: 2.4 10*3/uL (ref 1.7–7.7)
PLATELETS: 144 10*3/uL — AB (ref 150–400)
RBC: 3.09 MIL/uL — ABNORMAL LOW (ref 4.22–5.81)
RDW: 15.5 % (ref 11.5–15.5)
WBC: 4.1 10*3/uL (ref 4.0–10.5)

## 2014-01-04 LAB — COMPREHENSIVE METABOLIC PANEL
ALK PHOS: 58 U/L (ref 39–117)
ALT: 11 U/L (ref 0–53)
AST: 26 U/L (ref 0–37)
Albumin: 3.1 g/dL — ABNORMAL LOW (ref 3.5–5.2)
Anion gap: 13 (ref 5–15)
BUN: 18 mg/dL (ref 6–23)
CO2: 21 meq/L (ref 19–32)
Calcium: 8.9 mg/dL (ref 8.4–10.5)
Chloride: 100 mEq/L (ref 96–112)
Creatinine, Ser: 1.29 mg/dL (ref 0.50–1.35)
GFR calc Af Amer: 52 mL/min — ABNORMAL LOW (ref >90)
GFR calc non Af Amer: 45 mL/min — ABNORMAL LOW (ref >90)
GLUCOSE: 112 mg/dL — AB (ref 70–99)
POTASSIUM: 4.3 meq/L (ref 3.7–5.3)
SODIUM: 134 meq/L — AB (ref 137–147)
Total Bilirubin: 0.2 mg/dL — ABNORMAL LOW (ref 0.3–1.2)
Total Protein: 7.3 g/dL (ref 6.0–8.3)

## 2014-01-04 MED ORDER — PANTOPRAZOLE SODIUM 40 MG PO TBEC
40.0000 mg | DELAYED_RELEASE_TABLET | Freq: Every day | ORAL | Status: DC
  Administered 2014-01-04 – 2014-01-07 (×4): 40 mg via ORAL
  Filled 2014-01-04 (×4): qty 1

## 2014-01-04 MED ORDER — FINASTERIDE 5 MG PO TABS
5.0000 mg | ORAL_TABLET | Freq: Every day | ORAL | Status: DC
  Administered 2014-01-04 – 2014-01-07 (×4): 5 mg via ORAL
  Filled 2014-01-04 (×4): qty 1

## 2014-01-04 MED ORDER — INFLUENZA VAC SPLIT QUAD 0.5 ML IM SUSY
0.5000 mL | PREFILLED_SYRINGE | INTRAMUSCULAR | Status: DC
  Filled 2014-01-04 (×4): qty 0.5

## 2014-01-04 MED ORDER — METOPROLOL SUCCINATE ER 50 MG PO TB24
50.0000 mg | ORAL_TABLET | Freq: Every day | ORAL | Status: DC
  Administered 2014-01-04 – 2014-01-07 (×4): 50 mg via ORAL
  Filled 2014-01-04 (×4): qty 1

## 2014-01-04 MED ORDER — FLUTICASONE PROPIONATE 50 MCG/ACT NA SUSP
2.0000 | Freq: Every day | NASAL | Status: DC
  Administered 2014-01-04 – 2014-01-07 (×4): 2 via NASAL
  Filled 2014-01-04: qty 16

## 2014-01-04 MED ORDER — FENTANYL CITRATE 0.05 MG/ML IJ SOLN: 25.0000 ug | INTRAMUSCULAR | Status: DC | PRN

## 2014-01-04 MED ORDER — ENOXAPARIN SODIUM 30 MG/0.3ML ~~LOC~~ SOLN
30.0000 mg | SUBCUTANEOUS | Status: DC
  Administered 2014-01-04 – 2014-01-07 (×4): 30 mg via SUBCUTANEOUS
  Filled 2014-01-04 (×4): qty 0.3

## 2014-01-04 MED ORDER — ONDANSETRON HCL 4 MG PO TABS: 4.0000 mg | ORAL_TABLET | Freq: Four times a day (QID) | ORAL | Status: DC | PRN

## 2014-01-04 MED ORDER — SODIUM CHLORIDE 0.9 % IV SOLN: INTRAVENOUS | Status: DC

## 2014-01-04 MED ORDER — TRIAMCINOLONE ACETONIDE 0.1 % EX CREA
1.0000 "application " | TOPICAL_CREAM | Freq: Two times a day (BID) | CUTANEOUS | Status: DC
  Administered 2014-01-04: 1 via TOPICAL
  Filled 2014-01-04: qty 15

## 2014-01-04 MED ORDER — ACETAMINOPHEN 650 MG RE SUPP: 650.0000 mg | Freq: Four times a day (QID) | RECTAL | Status: DC | PRN

## 2014-01-04 MED ORDER — ONDANSETRON HCL 4 MG/2ML IJ SOLN: 4.0000 mg | Freq: Four times a day (QID) | INTRAMUSCULAR | Status: DC | PRN

## 2014-01-04 MED ORDER — ACETAMINOPHEN 325 MG PO TABS: 650.0000 mg | ORAL_TABLET | Freq: Four times a day (QID) | ORAL | Status: DC | PRN

## 2014-01-04 MED ORDER — ATORVASTATIN CALCIUM 40 MG PO TABS
40.0000 mg | ORAL_TABLET | Freq: Every day | ORAL | Status: DC
  Administered 2014-01-04 – 2014-01-06 (×3): 40 mg via ORAL
  Filled 2014-01-04 (×4): qty 1

## 2014-01-04 MED ORDER — AMLODIPINE BESYLATE 10 MG PO TABS
10.0000 mg | ORAL_TABLET | Freq: Every day | ORAL | Status: DC
  Administered 2014-01-04 – 2014-01-07 (×4): 10 mg via ORAL
  Filled 2014-01-04 (×4): qty 1

## 2014-01-04 NOTE — Progress Notes (Signed)
Patient ID: Luke Sanchez, male   DOB: 30-Jul-1914, 80 y.o.   MRN: [id number] [facility] PROGRESS NOTE  Luke Sanchez WUJ:[id number] DOB: Jan 31, 1915 DOA: 01/03/2014 PCP: Evlyn Courier, MD  Brief narrative: Addendum to admission note done 01/04/2014 80 y.o. male with history of CAD status post stenting, hypertension, hyperlipidemia, chronic anemia and history of GI bleed who presented to [facility] ED 01/04/2014 with back pain and difficulty walking. X-ray of the lumbar spine did not show acute findings. MRI lumbar spine is pending.   Assessment/Plan:    Principal Problem:   Intractable low back pain  X ray of pelvis and lumbar spine with no acute abnormalities.  MRI lumbar spine is pending  PT eval once pt able to participate  Active Problems:   HTN (hypertension)  Continue Norvasc and metoprolol    HLD (hyperlipidemia)  Continue statin therapy   DVT Prophylaxis   Lovenox subQ   Code Status: Full.  Family Communication:  Family not at the bedside this am Disposition Plan: Home when stable.    IV Access:   Peripheral IV Procedures and diagnostic studies:    Dg Chest 2 View  01/03/2014  Elevation of the right hemidiaphragm. Minimal left-sided atelectasis or scarring noted. Mild vascular congestion seen.     Dg Lumbar Spine Complete 01/03/2014  No evidence of acute bony abnormality.  Mild to moderate multilevel degenerative changes.    Dg Pelvis Portable 01/04/2014  No evidence for acute  abnormality.  Degenerative changes.     Medical Consultants:   None  Other Consultants:   Physical therapy  Anti-Infectives:   None    Manson Passey, MD  [facility] Pager [phone]  If 7PM-7AM, please contact night-coverage [email or web address] Password [email or web address] 01/04/2014, 2:50 PM   LOS: 1 day    HPI/Subjective: No acute overnight events.  Objective: Filed Vitals:   01/03/14 2259 01/04/14 0059 01/04/14 0116 01/04/14 0425  BP: 143/79 187/77 154/74 126/70  Pulse: 82 93  68   Temp:  98.3 F (36.8 C)  98.5 F (36.9 C)  TempSrc:  Oral  Oral  Resp: Height:  6' (1.829 m)    Weight:  65.3 kg (143 lb 15.4 oz)    SpO2: 90% 92%  99%    Intake/Output Summary (Last 24 hours) at 01/04/14 1450 Last data filed at 01/04/14 1353  Gross per 24 hour  Intake    240 ml  Output      0 ml  Net    240 ml    Exam:   General:  Pt is sleeping, no distress  Cardiovascular: Regular rate and rhythm, S1/S2 appreciated   Respiratory: Clear to auscultation bilaterally, no wheezing  Abdomen: Soft, non tender, non distended, bowel sounds present  Extremities: pulses DP and PT palpable bilaterally  Neuro: Grossly nonfocal  Data Reviewed: Basic Metabolic Panel:  Recent Labs Lab 01/03/14 2118 01/04/14 0410  NA 134* 134*  K 5.1 4.3  CL 99 100  CO2 22 21  GLUCOSE 88 112*  BUN 19 18  CREATININE 1.32 1.29  CALCIUM 9.5 8.9   Liver Function Tests:  Recent Labs Lab 01/03/14 2118 01/04/14 0410  AST 32 26  ALT 14 11  ALKPHOS 66 58  BILITOT 0.3 0.2*  PROT 8.5* 7.3  ALBUMIN 3.7 3.1*   No results found for this basename: LIPASE, AMYLASE,  in the last 168 hours No results found for this basename: AMMONIA,  in the last 168 hours CBC:  Recent Labs  Lab 01/03/14 2118 01/04/14 0410  WBC 5.7 4.1  NEUTROABS 3.5 2.4  HGB 9.5* 8.8*  HCT 29.2* 28.0*  MCV 89.8 90.6  PLT 171 144*   Cardiac Enzymes: No results found for this basename: CKTOTAL, CKMB, CKMBINDEX, TROPONINI,  in the last 168 hours BNP: No components found with this basename: POCBNP,  CBG: No results found for this basename: GLUCAP,  in the last 168 hours  No results found for this or any previous visit (from the past 240 hour(s)).   Scheduled Meds: . amLODipine  10 mg Oral Daily  . atorvastatin  40 mg Oral q1800  . enoxaparin (LOVENOX)   30 mg Subcutaneous Q24H  . finasteride  5 mg Oral Daily  . fluticasone  2 spray Each Nare Daily  . metoprolol succinate  50 mg Oral Daily  .  pantoprazole  40 mg Oral Daily  . triamcinolone cream  1 application Topical BID   Continuous Infusions: . sodium chloride

## 2014-01-04 NOTE — H&P (Signed)
[facility] History and Physical  Ko Bardon ZOX:[id number] DOB: [birth year] DOA: 01/03/2014  Referring physician: ER physician. PCP: Evlyn Courier, MD   Chief Complaint: Low back pain.  HPI: Luke Sanchez is a 80 y.o. male with history of CAD status post stenting, hypertension, hyperlipidemia, chronic anemia and history of GI bleed presents to the ER because of increasing low back pain. Patient states that he was doing well until 2 days ago when he started developing back pain with difficulty walking. Patient usually walks around with walker. Denies any fall or injury. Since he has been having increasing pain he came to the ER. X-ray of the lumbar spine did not show anything acute and patient has severe pain on moving his lower extremities and has been admitted for further management including MRI. Patient has chronic incontinence of urine but denies any incontinence of bowel. Denies any chest pain or shortness of breath nausea vomiting abdominal pain diarrhea fever chills.  Review of Systems: As presented in the history of presenting illness, rest negative.  Past Medical History  Diagnosis Date  . Hypertension   . Bronchitis   . Arthritis   . Pneumonia     hx of  . CAD (coronary artery disease)     stent RCA 2010-[name]   Past Surgical History  Procedure Laterality Date  . Coronary stent placement    . Hernia repair      x 2   Social History:  reports that he has never smoked. He has quit using smokeless tobacco. His smokeless tobacco use included Snuff and Chew. He reports that he does not drink alcohol or use illicit drugs. Where does patient live home. Can patient participate in ADLs? Not sure.  Allergies  Allergen Reactions  . Penicillins Anaphylaxis    Family History: History reviewed. No pertinent family history.    Prior to Admission medications   Medication Sig Start Date End Date Taking? Authorizing Provider  amLODipine (NORVASC) 5 MG tablet Take 2  tablets (10 mg total) by mouth daily. 06/19/12  Yes Ripudeep Jenna Luo, MD  ARTIFICIAL TEAR OP Apply 1 drop to eye daily as needed. For dry eyes   Yes Historical Provider, MD  dexlansoprazole (DEXILANT) 60 MG capsule Take 60 mg by mouth daily with breakfast.   Yes Historical Provider, MD  finasteride (PROSCAR) 5 MG tablet Take 5 mg by mouth daily.    Yes Historical Provider, MD  fluticasone (FLONASE) 50 MCG/ACT nasal spray Place 2 sprays into the nose daily. 02/11/13  Yes Garnetta Buddy, MD  loperamide (IMODIUM A-D) 2 MG tablet Take 1 tablet (2 mg total) by mouth 4 (four) times daily as needed for diarrhea or loose stools. 06/19/12  Yes Ripudeep Jenna Luo, MD  metoprolol (TOPROL-XL) 50 MG 24 hr tablet Take 50 mg by mouth daily.    Yes Historical Provider, MD  rosuvastatin (CRESTOR) 20 MG tablet Take 20 mg by mouth every morning.   Yes Historical Provider, MD  triamcinolone cream (KENALOG) 0.1 % Apply 1 application topically 2 (two) times daily. To eczema   Yes Historical Provider, MD    Physical Exam: Filed Vitals:   01/03/14 1955 01/03/14 2259 01/04/14 0059  BP: 153/60 143/79 187/77  Pulse: 77 82 93  Temp: 98.4 F (36.9 C)  98.3 F (36.8 C)  TempSrc: Oral  Oral  Resp: Height:   6' (1.829 m)  Weight:   65.3 kg (143 lb 15.4 oz)  SpO2: 93% 90% 92%  General:  Moderately built and nourished.  Eyes: Anicteric no pallor.  ENT: No discharge from ears eyes nose mouth.  Neck: No mass felt.  Cardiovascular: S1-S2 heard.  Respiratory: No rhonchi or crepitations.  Abdomen: Soft nontender bowel sounds present.  Skin: No rash.  Musculoskeletal: Pain on raising both legs.  Psychiatric: Appears normal.  Neurologic: Alert awake oriented to time place and person. Has good reflexes in lower extremities. No obvious focal deficits. PERRLA positive.  Labs on Admission:  Basic Metabolic Panel:  Recent Labs Lab 01/03/14 2118  NA 134*  K 5.1  CL 99  CO2 22  GLUCOSE 88  BUN 19   CREATININE 1.32  CALCIUM 9.5   Liver Function Tests:  Recent Labs Lab 01/03/14 2118  AST 32  ALT 14  ALKPHOS 66  BILITOT 0.3  PROT 8.5*  ALBUMIN 3.7   No results found for this basename: LIPASE, AMYLASE,  in the last 168 hours No results found for this basename: AMMONIA,  in the last 168 hours CBC:  Recent Labs Lab 01/03/14 2118  WBC 5.7  NEUTROABS 3.5  HGB 9.5*  HCT 29.2*  MCV 89.8  PLT 171   Cardiac Enzymes: No results found for this basename: CKTOTAL, CKMB, CKMBINDEX, TROPONINI,  in the last 168 hours  BNP (last 3 results) No results found for this basename: PROBNP,  in the last 8760 hours CBG: No results found for this basename: GLUCAP,  in the last 168 hours  Radiological Exams on Admission: Dg Chest 2 View  01/03/2014   CLINICAL DATA:  Flank pain.  Lower back pain.  EXAM: CHEST  2 VIEW  COMPARISON:  Chest radiograph performed 10/08/2012  FINDINGS: There is elevation of the right hemidiaphragm. Minimal left-sided atelectasis or scarring is seen. Mild vascular congestion is noted. No pleural effusion or pneumothorax is identified.  The heart is normal in size; the mediastinal contour is within normal limits. No acute osseous abnormalities are seen.  IMPRESSION: Elevation of the right hemidiaphragm. Minimal left-sided atelectasis or scarring noted. Mild vascular congestion seen.   Electronically Signed   By: Roanna Raider M.D.   On: 01/03/2014 21:31   Dg Lumbar Spine Complete  01/03/2014   CLINICAL DATA:  Low back pain.  EXAM: LUMBAR SPINE - COMPLETE 4+ VIEW  COMPARISON:  07/19/2005 radiographs  FINDINGS: There is no evidence of acute fracture or subluxation.  Mild to moderate multilevel degenerative disc disease, spondylosis and facet arthropathy again identified.  No focal bony lesions or spondylolysis identified.  There has been little change since prior study.  IMPRESSION: No evidence of acute bony abnormality.  Mild to moderate multilevel degenerative changes.    Electronically Signed   By: Laveda Abbe M.D.   On: 01/03/2014 21:33     Assessment/Plan Principal Problem:   Intractable low back pain Active Problems:   HTN (hypertension)   HLD (hyperlipidemia)   Normocytic normochromic anemia   Low back pain   1. Low back pain and difficulty ambulating - at this time I have ordered MRI of the L-spine T-spine to rule out any fractures. X-ray of the pelvis is pending. Patient is placed on fentanyl for when necessary pain relief. Physical therapy. Further recommendations based on MRI findings. 2. History of CAD status post stenting - patient states he was told not to take aspirin. Patient has had previous history of GI bleed. As the patient has no chest pain. 3. Hypertension - continue present medications. 4. Hyperlipidemia - continue statins. 5. Chronic  anemia with history of GI bleed - follow CBC.    Code Status: Full code.  Family Communication: None.  Disposition Plan: Admit to inpatient.    KAKRAKANDY,ARSHAD N. [facility] Pager [phone].  If 7PM-7AM, please contact night-coverage [email or web address] Password [email or web address] 01/04/2014, 1:02 AM

## 2014-01-04 NOTE — Progress Notes (Signed)
Lovenox per Pharmacy for DVT Prophylaxis    Pharmacy has been consulted from dosing enoxaparin (lovenox) in this patient for DVT prophylaxis.  The pharmacist has reviewed pertinent labs (Hgb _9.5__; PLT_171__), patient weight (_65__kg) and renal function (CrCl_29__mL/min) and decided that enoxaparin _30_mg SQ Q_24_Hrs is appropriate for this patient.  The pharmacy department will sign off at this time.  Please reconsult pharmacy if status changes or for further issues.  Thank you  Luetta Nutting PharmD, BCPS  01/04/2014, 1:09 AM

## 2014-01-04 NOTE — Progress Notes (Signed)
Nutrition Brief Note  Patient identified on the Malnutrition Screening Tool (MST) Report  Wt Readings from Last 15 Encounters:  01/04/14 143 lb 15.4 oz (65.3 kg)  06/19/12 141 lb 1.5 oz (64 kg)  03/05/11 142 lb 4.8 oz (64.547 kg)    Body mass index is 19.52 kg/(m^2). Patient meets criteria for Normal weight based on current BMI.   Current diet order is Heart healthy. Patient had not yet ordered meal during time of RD visit. Assisted in meal ordering. Pt noted he is unable to read menu, placed pt on Room Service Assist for pt to receive help with ordering meals/review food options. Labs and medications reviewed.   Pt denied any changes in appetite pta, reported that his daughter assists in his care at home and prepares meals. Will require softer foods during admit as he left his dentures at home; however, normally tolerates all diet textures. Denied any changes in weight, has remained stable around 140 lbs over past several years.  No nutrition interventions warranted at this time. If nutrition issues arise, please consult RD.   Lloyd Huger MS RD LDN Clinical Dietitian [phone]

## 2014-01-04 NOTE — Care Management Note (Unsigned)
Page 1 of 1   01/04/2014     4:24:36 PM CARE MANAGEMENT NOTE 01/04/2014  Patient:  Luke Sanchez, Luke Sanchez   Account Number:  [id number]  Date Initiated:  01/04/2014  Documentation initiated by:  Leafy Kindle  Subjective/Objective Assessment:   80 yo male admitted with low back pain.     Action/Plan:   from home with children   Anticipated DC Date:  01/05/2014   Anticipated DC Plan:  [place]  CM consult      Choice offered to / List presented to:             Status of service:  In process, will continue to follow Medicare Important Message given?   (If response is "NO", the following Medicare IM given date fields will be blank) Date Medicare IM given:   Medicare IM given by:   Date Additional Medicare IM given:   Additional Medicare IM given by:    Discharge Disposition:    Per UR Regulation:  Reviewed for med. necessity/level of care/duration of stay  If discussed at [place] of Stay Meetings, dates discussed:    Comments:  01/04/14 Allene Dillon RN BSN [phone] Met with pt at bedside to discuss his admission status. Made him aware that he only qualifies for OBS status and explained code 44 to him. He asked me to call his daughter Chapman Fitch and make her awre of this. I called and spoke to [place]. I explained to he that Medicare would not cover snf stay for rehab as that is what she was hopign for at d/c. I discussed with him [place] services. She wishes to use [place] if that is recommended.  01/04/14 Marney Doctor RN,BSN,NCM

## 2014-01-04 NOTE — Progress Notes (Signed)
PT Cancellation Note  Patient Details Name: Luke Sanchez MRN: [id number] DOB: 12-24-1914   Cancelled Treatment:    Reason Eval/Treat Not Completed: Medical issues which prohibited therapy (will await thoracic and lumber MRI results)   Venora Kautzman,KATHrine E 01/04/2014, 1:36 PM Zenovia Jarred, PT, DPT 01/04/2014 Pager: [phone]

## 2014-01-05 DIAGNOSIS — M545 Low back pain, unspecified: Secondary | ICD-10-CM | POA: Diagnosis not present

## 2014-01-05 DIAGNOSIS — E785 Hyperlipidemia, unspecified: Secondary | ICD-10-CM

## 2014-01-05 MED ORDER — HYDROCODONE-ACETAMINOPHEN 10-325 MG PO TABS
1.0000 | ORAL_TABLET | ORAL | Status: DC | PRN
Start: 2014-01-05 — End: 2014-01-07
  Administered 2014-01-05 – 2014-01-07 (×5): 1 via ORAL
  Filled 2014-01-05 (×5): qty 1

## 2014-01-05 NOTE — Evaluation (Signed)
Physical Therapy Evaluation Patient Details Name: Luke Sanchez MRN: [id number] DOB: 08/23/1914 Today's Date: 01/05/2014   History of Present Illness  pt 80 yo who was doing fairly well at home with his dtr (who unable to help much) and grandoughter stops by to assist when needed. 2 days ago with increased back pain and inability to walk around and ehlp himself like normal. Reports pain goes down R LE and feels tingling in B feet. x- and MRI neg for compression frctures, however MRI shows normal aging and some disc bulging.   Clinical Impression  Pt with back pain and radicular pain that has limited his mobility in past few days. Pt stated it has been hard to tend to self even toilet ing and dtr not in that good of health to assist. Prior to this he was able to do all ADLS and food prep at RW level. He feels ST-SNF would help get him stronger and more safe to return home. Recommend continued PT to assist with increasing safety , strength and independence with all mobility including tranfers, and gait.     Follow Up Recommendations SNF (short term with goal to return home)    Equipment Recommendations  None recommended by PT    Recommendations for Other Services       Precautions / Restrictions Precautions Precautions: Back Precaution Comments: educated pt on back awareness for good body mechanics to minimize strain on Low back as much as possible. (log roll, bed mobility etc)       Mobility  Bed Mobility Overal bed mobility: Needs Assistance Bed Mobility: Rolling;Sidelying to Sit Rolling: Min assist Sidelying to sit: Min assist       General bed mobility comments: cues for log roll and back mobility safety  Transfers Overall transfer level: Needs assistance Equipment used: Rolling walker (2 wheeled) Transfers: Sit to/from Stand Sit to Stand: Mod assist         General transfer comment: practiced 2x during session. First time with retropulsion and second time with less  retropulsion upon rise.   Ambulation/Gait Ambulation/Gait assistance: Min guard Ambulation Distance (Feet): 40 Feet (twice with 2 minute rest break in between during to feeling weak) Assistive device: Rolling walker (2 wheeled) Gait Pattern/deviations: Step-to pattern Gait velocity: slow   General Gait Details: small steps   Stairs            Wheelchair Mobility    Modified Rankin (Stroke Patients Only)       Balance                                             Pertinent Vitals/Pain Pain Assessment: 0-10 Pain Score: 3  (it feels much better than a few days ago, but still weak feeling and not able to move as well. ) Pain Location: back area, no radiating pain during session  Pain Descriptors / Indicators: Aching;Constant Pain Intervention(s): Repositioned;Monitored during session    Home Living Family/patient expects to be discharged to:: Private residence Living Arrangements: Children (lives with dtr who assists occassionaly however not in great health herself. grandaughter helps out PRN, ) Available Help at Discharge: Family Type of Home: House Home Access: Stairs to enter Entrance Stairs-Rails: Right Entrance Stairs-Number of Steps: 4 Home Layout: One level Home Equipment: Walker - 4 wheels;Hand held shower head;Cane - single point      Prior Function Level  of Independence: Independent with assistive device(s) (was MOD I prior to felling bad using RW in house and doing ADLS,and meal prep. )               Hand Dominance        Extremity/Trunk Assessment               Lower Extremity Assessment: Overall WFL for tasks assessed (no neuro defits from back noted during strenghth and ROM tesiting EOB. )         Communication   Communication: No difficulties  Cognition Arousal/Alertness: Awake/alert Behavior During Therapy: WFL for tasks assessed/performed Overall Cognitive Status: Within Functional Limits for tasks assessed                       General Comments      Exercises        Assessment/Plan    PT Assessment Patient needs continued PT services  PT Diagnosis Difficulty walking   PT Problem List Decreased strength;Decreased activity tolerance;Decreased mobility;Decreased safety awareness;Decreased knowledge of use of DME  PT Treatment Interventions DME instruction;Gait training;Functional mobility training;Therapeutic activities;Therapeutic exercise   PT Goals (Current goals can be found in the Care Plan section) Acute Rehab PT Goals Patient Stated Goal: Pt want to get back to doing for myself again.  PT Goal Formulation: With patient Time For Goal Achievement: 01/19/14 Potential to Achieve Goals: Good    Frequency Min 3X/week   Barriers to discharge        Co-evaluation               End of Session Equipment Utilized During Treatment: Gait belt Activity Tolerance: Patient tolerated treatment well (felt better after tried to get up and greed feels weak and may need a little rehab  prior to returning home,. ) Patient left: in chair;with call bell/phone within reach;with chair alarm set Nurse Communication: Mobility status    Functional Assessment Tool Used: clincial judgement Functional Limitation: Mobility: Walking and moving around Mobility: Walking and Moving Around Current Status (Z6109): At least 1 percent but less than 20 percent impaired, limited or restricted Mobility: Walking and Moving Around Goal Status [phone]): 0 percent impaired, limited or restricted    Time: 1015-1055 PT Time Calculation (min): 40 min   Charges:   PT Evaluation $Initial PT Evaluation Tier I: 1 Procedure PT Treatments $Gait Training: 23-37 mins $Therapeutic Activity: 8-22 mins   PT G Codes:   Functional Assessment Tool Used: clincial judgement Functional Limitation: Mobility: Walking and moving around    [place], [facility] 01/05/2014, 12:06 PM Marella Bile, PT Pager:  [phone] 01/05/2014

## 2014-01-05 NOTE — Progress Notes (Signed)
Patient ID: Luke Sanchez, male   DOB: March 30, 1915, 80 y.o.   MRN: [id number] [facility] PROGRESS NOTE  Luke Sanchez [id number] DOB: 11/22/1914 DOA: 01/03/2014 PCP: Evlyn Courier, MD  Brief narrative: 80 y.o. male with history of CAD status post stenting, hypertension, hyperlipidemia, chronic anemia and history of GI bleed who presented to [facility] ED 01/04/2014 with back pain and difficulty walking. X-ray of the lumbar spine did not show acute findings. MRI lumbar spine showed among other things pronounced L2-3, L3-4 bilateral facet arthropathy right worse than left and disc bulge at L4-5.   Assessment/Plan:   Principal Problem:  Intractable low back pain  X ray of pelvis and lumbar spine with no acute abnormalities.  MRI lumbar and thoracic spine showed pronounced L2-3, L3-4 bilateral facet arthropathy right worse than left and disc bulge at L4-5. Per neurosurgery order placed for lumbar corset and order placed for IR for epidural injection in L2-3 area. Per PT, SNF recommended and family agreeable with this plan. Active Problems:  HTN (hypertension)  Continue Norvasc and metoprolol  HLD (hyperlipidemia)  Continue statin therapy  DVT Prophylaxis  Lovenox subQ order.   Code Status: Full.  Family Communication: Family not at the bedside this am  Disposition Plan: per PT eval; SNF placement recommended. Family agrees with SNF placement. SW assisting D/C plan.    IV Access:   Peripheral IV Procedures and diagnostic studies:   Dg Chest 2 View 01/03/2014 Elevation of the right hemidiaphragm. Minimal left-sided atelectasis or scarring noted. Mild vascular congestion seen.  Dg Lumbar Spine Complete 01/03/2014 No evidence of acute bony abnormality. Mild to moderate multilevel degenerative changes.  Dg Pelvis Portable 01/04/2014 No evidence for acute abnormality. Degenerative changes.  MRI lumbar and thoracic spine 01/03/2014 - MR THORACIC SPINE IMPRESSION No significant finding in the  thoracic region. Wide patency of the canal. No cord abnormality. Lower thoracic ordinary facet degeneration which could relate to lower thoracic back ache. MR LUMBAR SPINE IMPRESSION L2-3: Pronounced bilateral facet arthropathy right worse than left. Retrolisthesis of 3 mm. Stenosis of both lateral recesses and foramina, right more than left. Neural compression could occur at this level, particularly on the right. The facet arthropathy could be painful. L3-4: Pronounced bilateral facet arthropathy right worse than left. Anterolisthesis of 3 mm. Shallow broad-based protrusion of disc material. Stenosis of both lateral recesses and foramina that could cause neural compression. The bilateral facet arthropathy could cause regional pain. L4-5: Disc bulge. Bilateral facet degeneration and hypertrophy. Mild stenosis of both lateral recesses and foramina. The facet arthropathy could be a source of pain. Medical Consultants:   Neurosurgery (Dr. Jeral Fruit) - phone call only. IR for possible epidural injection for pain control. Other Consultants:   Physical therapy  Anti-Infectives:   None    Manson Passey, MD  [facility] Pager [phone]  If 7PM-7AM, please contact night-coverage [email or web address] Password [email or web address] 01/05/2014, 5:07 PM   LOS: 2 days    HPI/Subjective: No acute overnight events.  Objective: Filed Vitals:   01/04/14 1500 01/04/14 2057 01/05/14 0441 01/05/14 1255  BP: 124/59 139/66 153/62 102/56  Pulse: 66 71 63 63  Temp: 98.6 F (37 C) 98.9 F (37.2 C) 97.1 F (36.2 C) 98.2 F (36.8 C)  TempSrc: Oral Oral Oral Oral  Resp: Height:      Weight:      SpO2: 98% 98% 99% 100%    Intake/Output Summary (Last 24 hours) at 01/05/14 1707 Last data filed at 01/05/14 1217  Gross per 24 hour  Intake    480 ml  Output   2050 ml  Net  -1570 ml    Exam:   General:  Pt is alert this am, no dsitress  Cardiovascular: Regular rate and rhythm, S1/S2 appreciated, SEM  appreciated   Respiratory: Clear to auscultation bilaterally, no wheezing, no crackles  Abdomen: Soft, non tender, non distended, bowel sounds present  Extremities: pulses DP and PT palpable bilaterally  Neuro: Grossly nonfocal  Data Reviewed: Basic Metabolic Panel:  Recent Labs Lab 01/03/14 2118 01/04/14 0410  NA 134* 134*  K 5.1 4.3  CL 99 100  CO2 22 21  GLUCOSE 88 112*  BUN 19 18  CREATININE 1.32 1.29  CALCIUM 9.5 8.9   Liver Function Tests:  Recent Labs Lab 01/03/14 2118 01/04/14 0410  AST 32 26  ALT 14 11  ALKPHOS 66 58  BILITOT 0.3 0.2*  PROT 8.5* 7.3  ALBUMIN 3.7 3.1*   No results found for this basename: LIPASE, AMYLASE,  in the last 168 hours No results found for this basename: AMMONIA,  in the last 168 hours CBC:  Recent Labs Lab 01/03/14 2118 01/04/14 0410  WBC 5.7 4.1  NEUTROABS 3.5 2.4  HGB 9.5* 8.8*  HCT 29.2* 28.0*  MCV 89.8 90.6  PLT 171 144*   Cardiac Enzymes: No results found for this basename: CKTOTAL, CKMB, CKMBINDEX, TROPONINI,  in the last 168 hours BNP: No components found with this basename: POCBNP,  CBG: No results found for this basename: GLUCAP,  in the last 168 hours  No results found for this or any previous visit (from the past 240 hour(s)).   Scheduled Meds: . amLODipine  10 mg Oral Daily  . atorvastatin  40 mg Oral q1800  . enoxaparin (LOVENOX)  30 mg Subcutaneous Q24H  . finasteride  5 mg Oral Daily  . fluticasone  2 spray Each Nare Daily  . metoprolol succinate  50 mg Oral Daily  . pantoprazole  40 mg Oral Daily   Continuous Infusions: . sodium chloride

## 2014-01-05 NOTE — Progress Notes (Signed)
UR completed 

## 2014-01-05 NOTE — Progress Notes (Signed)
Patient ID: Luke Sanchez, male   DOB: [birth year], 80 y.o.   MRN: [id number]   Request received for Lumbar ESI  PA will have Radiologist review this on Mon 9/21 Do not perform these on weekend

## 2014-01-05 NOTE — Clinical Social Work Note (Signed)
CSW met with patient and family at bedside- they are agreeable to SNF- full assessment and SNF workup to follow-  Eduard Clos, MSW, [place] weekend coverage

## 2014-01-06 DIAGNOSIS — M545 Low back pain, unspecified: Secondary | ICD-10-CM | POA: Diagnosis not present

## 2014-01-06 NOTE — Clinical Social Work Note (Signed)
Family contacted CSW asking what would SNF private pay run at  [facility]- I was able to give them an estimate which they are considering and will have weekday CSW follow up for further dc planning to SNF -vs- home.  Reece Levy, MSW, [place]res[place] Majors [phone] weekend coverage

## 2014-01-06 NOTE — Progress Notes (Signed)
CARE MANAGEMENT NOTE 01/06/2014  Patient:  Luke Sanchez, Luke Sanchez   Account Number:  [id number]  Date Initiated:  01/04/2014  Documentation initiated by:  Leafy Kindle  Subjective/Objective Assessment:   80 yo male admitted with low back pain.     Action/Plan:   from home with children   Anticipated DC Date:  01/07/2014   Anticipated DC Plan:  [place]  CM consult      [facility] Choice  HOME HEALTH   Choice offered to / List presented to:  C-4 Adult Children   DME arranged  3-N-1      DME agency  [place] arranged  [place].   Status of service:  Completed, signed off Medicare Important Message given?   (If response is "NO", the following Medicare IM given date fields will be blank) Date Medicare IM given:   Medicare IM given by:   Date Additional Medicare IM given:   Additional Medicare IM given by:    Discharge Disposition:  [place]  Per UR Regulation:  Reviewed for med. necessity/level of care/duration of stay  If discussed at [place] of Stay Meetings, dates discussed:    Comments:  01/06/2014 1300 NCM spoke to grand-dtr, [place] # [phone]. Grand-dtr requested [facility] for [facility]. Pt has RW, transport chair and tub bench. Requesting 3n1 for home. Notified [facility] for DME for home. Jonnie Finner RN CCM Case Mgmt phone [phone]   01/04/14 Allene Dillon RN BSN [phone] Met with pt at bedside to discuss his admission status. Made him aware that he only qualifies for OBS status and explained code 44 to him. He asked me to call his daughter Chapman Fitch and make her awre of this. I called and spoke to [place]. I explained to he that Medicare would not cover snf stay for rehab as that is what she was hopign for at d/c. I discussed with him [place] services. She wishes to use [place] if that is recommended.  01/04/14 Marney Doctor  RN,BSN,NCM

## 2014-01-06 NOTE — Progress Notes (Signed)
Patient ID: Luke Sanchez, male   DOB: 11/23/1914, 80 y.o.   MRN: [id number] [facility] PROGRESS NOTE  Luke Sanchez [id number] DOB: 1915/01/30 DOA: 01/03/2014 PCP: Evlyn Courier, MD  Brief narrative: 80 y.o. male with history of CAD status post stenting, hypertension, hyperlipidemia, chronic anemia and history of GI bleed who presented to [facility] ED 01/04/2014 with back pain and difficulty walking. X-ray of the lumbar spine did not show acute findings. MRI lumbar spine showed among other things pronounced L2-3, L3-4 bilateral facet arthropathy right worse than left and disc bulge at L4-5.   Assessment/Plan:   Principal Problem:  Intractable low back pain  On admission, X ray of pelvis and lumbar spine with no acute abnormalities. MRI lumbar and thoracic spine showed pronounced L2-3, L3-4 bilateral facet arthropathy right worse than left and disc bulge at L4-5.  Per neurosurgery order placed for lumbar corset and for IR for epidural injection in L2-3 area which will be done Monday 01/07/2014.    Active Problems:  HTN (hypertension)  Continue Norvasc and metoprolol  HLD (hyperlipidemia)  Continue statin therapy  DVT Prophylaxis  Lovenox subQ order.   Code Status: Full.  Family Communication: Family not at the bedside this am  Disposition Plan: home with home health likely in next 24-48 hours.    IV Access:   Peripheral IV Procedures and diagnostic studies:   Dg Chest 2 View 01/03/2014 Elevation of the right hemidiaphragm. Minimal left-sided atelectasis or scarring noted. Mild vascular congestion seen.  Dg Lumbar Spine Complete 01/03/2014 No evidence of acute bony abnormality. Mild to moderate multilevel degenerative changes.  Dg Pelvis Portable 01/04/2014 No evidence for acute abnormality. Degenerative changes.  MRI lumbar and thoracic spine 01/03/2014 - MR THORACIC SPINE IMPRESSION No significant finding in the thoracic region. Wide patency of the canal. No cord abnormality. Lower  thoracic ordinary facet degeneration which could relate to lower thoracic back ache. MR LUMBAR SPINE IMPRESSION L2-3: Pronounced bilateral facet arthropathy right worse than left. Retrolisthesis of 3 mm. Stenosis of both lateral recesses and foramina, right more than left. Neural compression could occur at this level, particularly on the right. The facet arthropathy could be painful. L3-4: Pronounced bilateral facet arthropathy right worse than left. Anterolisthesis of 3 mm. Shallow broad-based protrusion of disc material. Stenosis of both lateral recesses and foramina that could cause neural compression. The bilateral facet arthropathy could cause regional pain. L4-5: Disc bulge. Bilateral facet degeneration and hypertrophy. Mild stenosis of both lateral recesses and foramina. The facet arthropathy could be a source of pain. Medical Consultants:   Neurosurgery (Dr. Jeral Fruit) - phone call only.  IR for possible epidural injection for pain control. Other Consultants:   Physical therapy  Anti-Infectives:   None    Manson Passey, MD  [facility] Pager [phone]  If 7PM-7AM, please contact night-coverage [email or web address] Password [facility] 01/06/2014, 12:55 PM   LOS: 3 days    HPI/Subjective: No acute overnight events.  Objective: Filed Vitals:   01/05/14 0441 01/05/14 1255 01/05/14 2127 01/06/14 0532  BP: 153/62 102/56 163/76 121/65  Pulse: 63 63 61 62  Temp: 97.1 F (36.2 C) 98.2 F (36.8 C) 97.4 F (36.3 C) 97.8 F (36.6 C)  TempSrc: Oral Oral Oral Oral  Resp: Height:      Weight:      SpO2: 99% 100% 97% 97%    Intake/Output Summary (Last 24 hours) at 01/06/14 1255 Last data filed at 01/06/14 0535  Gross per 24 hour  Intake    820 ml  Output   1500 ml  Net   -680 ml    Exam:   General:  Pt is alert, sitting, eating breakfast  Cardiovascular: Regular rate and rhythm, S1/S2, no murmurs  Respiratory: no wheezing, bilateral air entry   Extremities: Pulses  DP and PT palpable bilaterally  Neuro: Grossly nonfocal  Data Reviewed: Basic Metabolic Panel:  Recent Labs Lab 01/03/14 2118 01/04/14 0410  NA 134* 134*  K 5.1 4.3  CL 99 100  CO2 22 21  GLUCOSE 88 112*  BUN 19 18  CREATININE 1.32 1.29  CALCIUM 9.5 8.9   Liver Function Tests:  Recent Labs Lab 01/03/14 2118 01/04/14 0410  AST 32 26  ALT 14 11  ALKPHOS 66 58  BILITOT 0.3 0.2*  PROT 8.5* 7.3  ALBUMIN 3.7 3.1*   No results found for this basename: LIPASE, AMYLASE,  in the last 168 hours No results found for this basename: AMMONIA,  in the last 168 hours CBC:  Recent Labs Lab 01/03/14 2118 01/04/14 0410  WBC 5.7 4.1  NEUTROABS 3.5 2.4  HGB 9.5* 8.8*  HCT 29.2* 28.0*  MCV 89.8 90.6  PLT 171 144*   Cardiac Enzymes: No results found for this basename: CKTOTAL, CKMB, CKMBINDEX, TROPONINI,  in the last 168 hours BNP: No components found with this basename: POCBNP,  CBG: No results found for this basename: GLUCAP,  in the last 168 hours  No results found for this or any previous visit (from the past 240 hour(s)).   Scheduled Meds: . amLODipine  10 mg Oral Daily  . atorvastatin  40 mg Oral q1800  . enoxaparin (LOVENOX) injection  30 mg Subcutaneous Q24H  . finasteride  5 mg Oral Daily  . fluticasone  2 spray Each Nare Daily  . Influenza vac split quadrivalent PF  0.5 mL Intramuscular Tomorrow-1000  . metoprolol succinate  50 mg Oral Daily  . pantoprazole  40 mg Oral Daily  . triamcinolone cream  1 application Topical BID   Continuous Infusions: . sodium chloride

## 2014-01-06 NOTE — Clinical Social Work Placement (Addendum)
Clinical Social Work Department CLINICAL SOCIAL WORK PLACEMENT NOTE 01/06/2014  Patient:  Luke Sanchez, Luke Sanchez  Account Number:  [id number] Admit date:  01/03/2014  Clinical Social Worker:  Robin Searing  Date/time:  01/06/2014 09:55 AM  Clinical Social Work is seeking post-discharge placement for this patient at the following level of care:   SKILLED NURSING   (*CSW will update this form in Epic as items are completed)   01/06/2014  Patient/family provided with [facility] Department of Clinical Social Work's list of facilities offering this level of care within the geographic area requested by the patient (or if unable, by the patient's family).  01/06/2014  Patient/family informed of their freedom to choose among providers that offer the needed level of care, that participate in Medicare, Medicaid or managed care program needed by the patient, have an available bed and are willing to accept the patient.  01/06/2014  Patient/family informed of [facility]' ownership interest in [facility], as well as of the fact that they are under no obligation to receive care at this facility.  PASARR submitted to EDS on 01/06/2014 PASARR number received on 01/06/2014  FL2 transmitted to all facilities in geographic area requested by pt/family on  01/06/2014 FL2 transmitted to all facilities within larger geographic area on   Patient informed that his/her managed care company has contracts with or will negotiate with  certain facilities, including the following:     Patient/family informed of bed offers received: 01/07/2014 Patient chooses bed at [facility] as private pay Physician recommends and patient chooses bed at    Patient to be transferred to  on  [place] Patient to be transferred to facility by ambulance [name]) Patient and family notified of transfer on 01/07/2014  Name of family member notified:  Pt notified at bedside along with pt granddaughter, [name]  The  following physician request were entered in Epic:   Additional Comments:   Reece Levy, MSW, LCSWA [phone] weekend coverage   Loletta Specter, MSW, LCSW Clinical Social Work [phone]

## 2014-01-06 NOTE — Clinical Social Work Note (Signed)
CSW confirmed with RNCM and UR RN that patient is an OBS and not meeting for inpatient care- he, therefore, will not be covered by Medicare for SNF placement. I have discussed this with patient's granddaughter and daughter and they understand and agree to plans to dc home with [facility] services-family requesting all the services he can get (including bath aide)- will advise RNCM for [facility] arrangements to be made.  Reece Levy, MSW, [place]resia Majors [phone] weekend coverage

## 2014-01-06 NOTE — Clinical Social Work Psychosocial (Signed)
Clinical Social Work Department BRIEF PSYCHOSOCIAL ASSESSMENT 01/06/2014  Patient:  Luke Sanchez, Luke Sanchez     Account Number:  [id number]     Admit date:  01/03/2014  Clinical Social Worker:  Daiva Huge  Date/Time:  01/06/2014 09:46 AM  Referred by:  Physician  Date Referred:  01/06/2014 Referred for  SNF Placement   Other Referral:   Interview type:  Other - See comment Other interview type:   Met with patient and a granddaughter- Tashana    PSYCHOSOCIAL DATA Living Status:  FAMILY Admitted from facility:   Level of care:   Primary support name:  daughter- Sunday Spillers, granddaughter and other extended family Primary support relationship to patient:  FAMILY Degree of support available:   good    CURRENT CONCERNS Current Concerns  Post-Acute Placement   Other Concerns:    SOCIAL WORK ASSESSMENT / PLAN Met with family to discuss ?SNF at d/c- patient very alert and independent prior to this hospitalization- he lives with a daughter and has lots of family and friends support- Patient and his family are open to considering SNF at d/c if needed but stress that it would only be for short term-  Patient in good spirits and more comfortable after back brace was placed-   Assessment/plan status:  Other - See comment Other assessment/ plan:   FL2 and PASARR for ?SNF   Information/referral to community resources:   SNF List    PATIENT'S/FAMILY'S RESPONSE TO PLAN OF CARE: CSW advised patient and family of plans for SNF search- they plan to research area SNF's to determine preferences- CSW will follow up to give them offers as rec'd and for possible dc as soon as MOnday- they are aware.       Eduard Clos, MSW, [place]

## 2014-01-07 DIAGNOSIS — M545 Low back pain, unspecified: Secondary | ICD-10-CM | POA: Diagnosis not present

## 2014-01-07 LAB — CREATININE, SERUM
Creatinine, Ser: 1.35 mg/dL (ref 0.50–1.35)
GFR calc Af Amer: 49 mL/min — ABNORMAL LOW (ref >90)
GFR calc non Af Amer: 42 mL/min — ABNORMAL LOW (ref >90)

## 2014-01-07 MED ORDER — ACETAMINOPHEN 325 MG PO TABS: 650.0000 mg | ORAL_TABLET | Freq: Four times a day (QID) | ORAL | Status: AC | PRN

## 2014-01-07 MED ORDER — OXYCODONE HCL 5 MG PO TABS: 5.0000 mg | ORAL_TABLET | ORAL | Status: DC | PRN

## 2014-01-07 MED ORDER — OXYCODONE HCL 5 MG PO TABS
5.0000 mg | ORAL_TABLET | ORAL | Status: DC | PRN
  Administered 2014-01-07 (×2): 10 mg via ORAL
  Filled 2014-01-07 (×2): qty 2

## 2014-01-07 NOTE — Progress Notes (Signed)
Physical Therapy Treatment Patient Details Name: Luke Sanchez MRN: [id number] DOB: 1915-03-09 Today's Date: 01/07/2014    History of Present Illness Pt is a 80 yo who was doing fairly well at home with his dtr (unable to help much) and granddaughter stops by to assist when needed. Pt presented with increased back pain and inability to walk around and help himself like normal. Pt reports pain goes down R LE and feels tingling in B feet. x- and MRI neg for compression frctures, however MRI shows normal aging and some disc bulging.     PT Comments    Pt requires increased time for transfers and mobility.  Pt currently requiring at least min assist at this time so continue to recommend SNF.  Follow Up Recommendations  SNF     Equipment Recommendations  None recommended by PT    Recommendations for Other Services       Precautions / Restrictions Precautions Precautions: Back Required Braces or Orthoses: Spinal Brace Spinal Brace: Lumbar corset;Applied in sitting position;Other (comment) Spinal Brace Comments: lumbar brace in room so applied in sitting, pt able to don with supervision    Mobility  Bed Mobility Overal bed mobility: Needs Assistance Bed Mobility: Rolling;Sidelying to Sit Rolling: Min assist Sidelying to sit: Min assist       General bed mobility comments: verbal cues for log roll technique  Transfers Overall transfer level: Needs assistance Equipment used: Rolling walker (2 wheeled) Transfers: Sit to/from [employer] Sit to Stand: Min assist Stand pivot transfers: Min assist       General transfer comment: verbal cues for safe technique, assist to rise and steady, posterior lean against bed to stabilize, increased time, pt assisted to [facility] for BM prior to ambulation  Ambulation/Gait Ambulation/Gait assistance: Min assist Ambulation Distance (Feet): 50 Feet Assistive device: Rolling walker (2 wheeled) Gait Pattern/deviations: Step-to  pattern;Antalgic;Decreased stance time - right Gait velocity: decreased   General Gait Details: small steps, heavily leans to L side due to "stronger side", verbal cues for RW positioning   Stairs            Wheelchair Mobility    Modified Rankin (Stroke Patients Only)       Balance                                    Cognition Arousal/Alertness: Awake/alert Behavior During Therapy: WFL for tasks assessed/performed Overall Cognitive Status: Within Functional Limits for tasks assessed                      Exercises      General Comments        Pertinent Vitals/Pain Pain Assessment: 0-10 Pain Score: 3  Pain Location: low back, occasional bil knee pain during gait Pain Descriptors / Indicators: Aching;Discomfort Pain Intervention(s): Limited activity within patient's tolerance;Monitored during session;Premedicated before session;Repositioned    Home Living                      Prior Function            PT Goals (current goals can now be found in the care plan section) Progress towards PT goals: Progressing toward goals    Frequency  Min 3X/week    PT Plan Current plan remains appropriate    Co-evaluation             End of Session  Equipment Utilized During Treatment: Gait belt Activity Tolerance: Patient tolerated treatment well Patient left: in chair;with call bell/phone within reach     Time: 0865-7846 PT Time Calculation (min): 28 min  Charges:  $Gait Training: 8-22 mins $Therapeutic Activity: 8-22 mins                    G Codes:      Tyce Delcid,KATHrine E 02/03/2014, 1:53 PM Zenovia Jarred, PT, DPT February 03, 2014 Pager: [phone]

## 2014-01-07 NOTE — Progress Notes (Signed)
Attempted to call report to [facility]. Was given a number for the nursing supervisor in which I kept getting a voicemail.

## 2014-01-07 NOTE — Progress Notes (Signed)
Pt for discharge to [facility].   CSW facilitated pt discharge needs including contacting facility, faxing pt discharge information via [facility], discussing with pt and pt granddaughter at bedside, providing RN phone number to call report, and arranging ambulance transport via [facility] to [facility].  No further social work needs identified at this time.  CSW signing off.   Loletta Specter, MSW, LCSW Clinical Social Work [phone]

## 2014-01-07 NOTE — Care Management Note (Signed)
CARE MANAGEMENT NOTE 01/07/2014  Patient:  GARLAN, DREWES   Account Number:  [id number]  Date Initiated:  01/04/2014  Documentation initiated by:  Leafy Kindle  Subjective/Objective Assessment:   80 yo male admitted with low back pain.     Action/Plan:   from home with children   Anticipated DC Date:  01/07/2014   Anticipated DC Plan:  [place]  CM consult      [facility] Choice  HOME HEALTH   Choice offered to / List presented to:  C-4 Adult Children   DME arranged  3-N-1      DME agency  [place] arranged  [place].   Status of service:  Completed, signed off Medicare Important Message given?  YES (If response is "NO", the following Medicare IM given date fields will be blank) Date Medicare IM given:  01/07/2014 Medicare IM given by:  Leafy Kindle Date Additional Medicare IM given:   Additional Medicare IM given by:    Discharge Disposition:  [place]  Per UR Regulation:  Reviewed for med. necessity/level of care/duration of stay  If discussed at [place] of Stay Meetings, dates discussed:    Comments:  01/07/14 Marney Doctor RN,BSN,NCM [phone] Pt to go to [facility] place as private pay.  01/06/2014 1300 NCM spoke to Ardis Rowan # [id number]. Granddtr requested [facility] for [facility]. Pt has RW, transport chair and tub bench. Requesting 3n1 for home. Notified [facility] for DME for home. Jonnie Finner RN CCM Case Mgmt phone [phone]   01/04/14 Allene Dillon RN BSN [phone] Met with pt at bedside to discuss his admission status. Made him aware that he only qualifies for OBS status and explained code 44 to him. He asked me to call his daughter Chapman Fitch and make her awre of this. I called and spoke to [place]. I explained to he that Medicare would not cover snf stay for rehab as that is what she was hopign for at d/c. I discussed  with him [place] services. She wishes to use [facility] if that is recommended.  01/04/14 Marney Doctor RN,BSN,NCM

## 2014-01-07 NOTE — Discharge Summary (Signed)
Physician Discharge Summary  Luke Sanchez ZOX:[id number] DOB: 08-14-1914 DOA: 01/03/2014  PCP: Evlyn Courier, MD  Admit date: 01/03/2014 Discharge date: 01/07/2014  Recommendations for Outpatient Follow-up:  1. Please check CBC and BMP per skilled nursing facility protocol.  Discharge Diagnoses:  Principal Problem:   Intractable low back pain Active Problems:   HTN (hypertension)   HLD (hyperlipidemia)   Normocytic normochromic anemia   Low back pain    Discharge Condition: stable   Diet recommendation: as tolerated   History of present illness:  80 y.o. male with history of CAD status post stenting, hypertension, hyperlipidemia, chronic anemia and history of GI bleed who presented to [facility] ED 01/04/2014 with back pain and difficulty walking. X-ray of the lumbar spine did not show acute findings. MRI lumbar spine showed among other things pronounced L2-3, L3-4 bilateral facet arthropathy right worse than left and disc bulge at L4-5.   Assessment/Plan:   Principal Problem:  Intractable low back pain  On admission, X ray of pelvis and lumbar spine with no acute abnormalities. MRI lumbar and thoracic spine showed pronounced L2-3, L3-4 bilateral facet arthropathy right worse than left and disc bulge at L4-5.  Per neurosurgery order placed for lumbar corset and for IR for epidural injection in L2-3 area to be done Monday 01/07/2014.  Order also placed for cervical collar as well per family request. Active Problems:  HTN (hypertension)  Continue Norvasc and metoprolol  HLD (hyperlipidemia)  Continue statin therapy  DVT Prophylaxis  Lovenox subQ order while patient is in hospital.   Code Status: Full.  Family Communication: Family not at the bedside this am; updated family over the phone. Agree with discharge plan to skilled nursing facility today.  IV Access:   Peripheral IV Procedures and diagnostic studies:   Dg Chest 2 View 01/03/2014 Elevation of the right hemidiaphragm.  Minimal left-sided atelectasis or scarring noted. Mild vascular congestion seen.  Dg Lumbar Spine Complete 01/03/2014 No evidence of acute bony abnormality. Mild to moderate multilevel degenerative changes.  Dg Pelvis Portable 01/04/2014 No evidence for acute abnormality. Degenerative changes.  MRI lumbar and thoracic spine 01/03/2014 - MR THORACIC SPINE IMPRESSION No significant finding in the thoracic region. Wide patency of the canal. No cord abnormality. Lower thoracic ordinary facet degeneration which could relate to lower thoracic back ache. MR LUMBAR SPINE IMPRESSION L2-3: Pronounced bilateral facet arthropathy right worse than left. Retrolisthesis of 3 mm. Stenosis of both lateral recesses and foramina, right more than left. Neural compression could occur at this level, particularly on the right. The facet arthropathy could be painful. L3-4: Pronounced bilateral facet arthropathy right worse than left. Anterolisthesis of 3 mm. Shallow broad-based protrusion of disc material. Stenosis of both lateral recesses and foramina that could cause neural compression. The bilateral facet arthropathy could cause regional pain. L4-5: Disc bulge. Bilateral facet degeneration and hypertrophy. Mild stenosis of both lateral recesses and foramina. The facet arthropathy could be a source of pain. Medical Consultants:   Neurosurgery (Dr. Jeral Fruit) - phone call only.  IR for possible epidural injection for pain control. Other Consultants:   Physical therapy  Anti-Infectives:   None    Signed:  Manson Passey, MD  [facility] 01/07/2014, 11:05 AM  Pager #: [phone]   Discharge Exam: Filed Vitals:   01/07/14 0614  BP: 123/64  Pulse: 70  Temp: 98.3 F (36.8 C)  Resp: 16   Filed Vitals:   01/06/14 0532 01/06/14 1349 01/06/14 2110 01/07/14 0614  BP: 121/65 117/60 134/61 123/64  Pulse: 62 62 67 70  Temp: 97.8 F (36.6 C) 98.2 F (36.8 C) 98.5 F (36.9 C) 98.3 F (36.8 C)  TempSrc: Oral Oral  Oral Oral  Resp: 16 16 16 16   Height:      Weight:      SpO2: 97% 98% 97% 97%    General: Pt is not in distress Cardiovascular: RRR, S1/S2 appreciated  Respiratory: Clear to auscultation bilaterally, no wheezing, no crackles, no rhonchi Abdominal: Soft, non tender, non distended, bowel sounds +, no guarding Extremities: no cyanosis, pulses palpable bilaterally DP and PT Neuro: Grossly nonfocal  Discharge Instructions  Discharge Instructions   Call MD for:  difficulty breathing, headache or visual disturbances    Complete by:  As directed      Call MD for:  persistant dizziness or light-headedness    Complete by:  As directed      Call MD for:  persistant nausea and vomiting    Complete by:  As directed      Call MD for:  severe uncontrolled pain    Complete by:  As directed      Diet - low sodium heart healthy    Complete by:  As directed      Increase activity slowly    Complete by:  As directed             Medication List         acetaminophen 325 MG tablet  Commonly known as:  TYLENOL  Take 2 tablets (650 mg total) by mouth every 6 (six) hours as needed for mild pain (or Fever >/= 101).     amLODipine 5 MG tablet  Commonly known as:  NORVASC  Take 2 tablets (10 mg total) by mouth daily.     ARTIFICIAL TEAR OP  Apply 1 drop to eye daily as needed. For dry eyes     DEXILANT 60 MG capsule  Generic drug:  dexlansoprazole  Take 60 mg by mouth daily with breakfast.     finasteride 5 MG tablet  Commonly known as:  PROSCAR  Take 5 mg by mouth daily.     fluticasone 50 MCG/ACT nasal spray  Commonly known as:  FLONASE  Place 2 sprays into the nose daily.     loperamide 2 MG tablet  Commonly known as:  IMODIUM A-D  Take 1 tablet (2 mg total) by mouth 4 (four) times daily as needed for diarrhea or loose stools.     metoprolol succinate 50 MG 24 hr tablet  Commonly known as:  TOPROL-XL  Take 50 mg by mouth daily.     oxyCODONE 5 MG immediate release tablet   Commonly known as:  Oxy IR/ROXICODONE  Take 1-2 tablets (5-10 mg total) by mouth every 4 (four) hours as needed for moderate pain.     rosuvastatin 20 MG tablet  Commonly known as:  CRESTOR  Take 20 mg by mouth every morning.     triamcinolone cream 0.1 %  Commonly known as:  KENALOG  Apply 1 application topically 2 (two) times daily. To eczema           Follow-up Information   Follow up with [facility]. [facility] RN, Physical Therapy, Occupational Therapy, aide)    Contact information:   [address] [place] [address] [phone]       Schedule an appointment as soon as possible for a visit with HILL,GERALD K, MD. (Follow up appt after recent hospitalization)  Specialty:  Family Medicine   Contact information:   [address] [place] [address] [phone]        The results of significant diagnostics from this hospitalization (including imaging, microbiology, ancillary and laboratory) are listed below for reference.    Significant Diagnostic Studies: Dg Chest 2 View  01/03/2014   CLINICAL DATA:  Flank pain.  Lower back pain.  EXAM: CHEST  2 VIEW  COMPARISON:  Chest radiograph performed 10/08/2012  FINDINGS: There is elevation of the right hemidiaphragm. Minimal left-sided atelectasis or scarring is seen. Mild vascular congestion is noted. No pleural effusion or pneumothorax is identified.  The heart is normal in size; the mediastinal contour is within normal limits. No acute osseous abnormalities are seen.  IMPRESSION: Elevation of the right hemidiaphragm. Minimal left-sided atelectasis or scarring noted. Mild vascular congestion seen.   Electronically Signed   By: Roanna Raider M.D.   On: 01/03/2014 21:31   Dg Lumbar Spine Complete  01/03/2014   CLINICAL DATA:  Low back pain.  EXAM: LUMBAR SPINE - COMPLETE 4+ VIEW  COMPARISON:  07/19/2005 radiographs  FINDINGS: There is no evidence of acute fracture or subluxation.  Mild to  moderate multilevel degenerative disc disease, spondylosis and facet arthropathy again identified.  No focal bony lesions or spondylolysis identified.  There has been little change since prior study.  IMPRESSION: No evidence of acute bony abnormality.  Mild to moderate multilevel degenerative changes.   Electronically Signed   By: Laveda Abbe M.D.   On: 01/03/2014 21:33   Mr Thoracic Spine Wo Contrast  01/04/2014   CLINICAL DATA:  Thoracic and lumbar back pain.  EXAM: MRI THORACIC AND LUMBAR SPINE WITHOUT CONTRAST  TECHNIQUE: Multiplanar and multiecho pulse sequences of the thoracic and lumbar spine were obtained without intravenous contrast.  COMPARISON:  Radiography 01/03/2014.  FINDINGS: MR THORACIC SPINE FINDINGS  Scout view in the cervical region for counting purposes does not show any advanced disease.  T1-2:  Mild bulging of the disc.  No canal stenosis.  Below that, the intervertebral discs appear normal without bulge or herniation. The spinal canal is widely patent with ample subarachnoid space surrounding the cord. 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 This could be a cause of back ache. There is root sleeve ectasia at a number of levels. This is a normal variant and not of any clinical significance. There is no evidence of thoracic cord abnormality.  MR LUMBAR SPINE FINDINGS  L1-2: Mild bulging of the disc. Mild facet and ligamentous hypertrophy. No compressive stenosis. Conus tip at upper L1.  L2-3: Retrolisthesis of 3 mm. Endplate osteophytes and bulging of the disc. Pronounced bilateral facet degeneration and hypertrophy, right worse than left. Stenosis of both lateral recesses and foramina, right worse than left. Neural compression could occur at this level, particularly on the right. The facet arthropathy could also be a cause of localized back pain.  L3-4: Anterolisthesis 3 mm. This is due to bilateral  facet degeneration and hypertrophy, right more than left. There is shallow broad-based protrusion of disc material. There is stenosis of both lateral recesses and foramina, right more than left. This is not as severe as the L2-3 level. The facet arthropathy could be a cause of pain.  L4-5: Endplate osteophytes and bulging of the disc. Bilateral facet degeneration without slippage. Mild stenosis of both lateral recesses and neural foramina without gross  neural compression. The facet arthropathy could relate to low back pain.  L5-S1: Mild bulging of the disc. Bilateral facet degeneration. No compressive canal or foraminal stenosis.  IMPRESSION: MR THORACIC SPINE IMPRESSION  No significant finding in the thoracic region. Wide patency of the canal. No cord abnormality. Lower thoracic ordinary facet degeneration which could relate to lower thoracic back ache.  MR LUMBAR SPINE IMPRESSION  L2-3: Pronounced bilateral facet arthropathy right worse than left. Retrolisthesis of 3 mm. Stenosis of both lateral recesses and foramina, right more than left. Neural compression could occur at this level, particularly on the right. The facet arthropathy could be painful.  L3-4: Pronounced bilateral facet arthropathy right worse than left. Anterolisthesis of 3 mm. Shallow broad-based protrusion of disc material. Stenosis of both lateral recesses and foramina that could cause neural compression. The bilateral facet arthropathy could cause regional pain.  L4-5: Disc bulge. Bilateral facet degeneration and hypertrophy. Mild stenosis of both lateral recesses and foramina. The facet arthropathy could be a source of pain.   Electronically Signed   By: Paulina Fusi M.D.   On: 01/04/2014 19:26   Mr Lumbar Spine Wo Contrast  01/04/2014   CLINICAL DATA:  Thoracic and lumbar back pain.  EXAM: MRI THORACIC AND LUMBAR SPINE WITHOUT CONTRAST  TECHNIQUE: Multiplanar and multiecho pulse sequences of the thoracic and lumbar spine were obtained  without intravenous contrast.  COMPARISON:  Radiography 01/03/2014.  FINDINGS: MR THORACIC SPINE FINDINGS  Scout view in the cervical region for counting purposes does not show any advanced disease.  T1-2:  Mild bulging of the disc.  No canal stenosis.  Below that, the intervertebral discs appear normal without bulge or herniation. The spinal canal is widely patent with ample subarachnoid space surrounding the cord. 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 This could be a cause of back ache. There is root sleeve ectasia at a number of levels. This is a normal variant and not of any clinical significance. There is no evidence of thoracic cord abnormality.  MR LUMBAR SPINE FINDINGS  L1-2: Mild bulging of the disc. Mild facet and ligamentous hypertrophy. No compressive stenosis. Conus tip at upper L1.  L2-3: Retrolisthesis of 3 mm. Endplate osteophytes and bulging of the disc. Pronounced bilateral facet degeneration and hypertrophy, right worse than left. Stenosis of both lateral recesses and foramina, right worse than left. Neural compression could occur at this level, particularly on the right. The facet arthropathy could also be a cause of localized back pain.  L3-4: Anterolisthesis 3 mm. This is due to bilateral facet degeneration and hypertrophy, right more than left. There is shallow broad-based protrusion of disc material. There is stenosis of both lateral recesses and foramina, right more than left. This is not as severe as the L2-3 level. The facet arthropathy could be a cause of pain.  L4-5: Endplate osteophytes and bulging of the disc. Bilateral facet degeneration without slippage. Mild stenosis of both lateral recesses and neural foramina without gross neural compression. The facet arthropathy could relate to low back pain.  L5-S1: Mild bulging of the disc. Bilateral facet degeneration. No compressive canal or  foraminal stenosis.  IMPRESSION: MR THORACIC SPINE IMPRESSION  No significant finding in the thoracic region. Wide patency of the canal. No cord abnormality. Lower thoracic ordinary facet degeneration which could relate to lower thoracic back ache.  MR LUMBAR SPINE IMPRESSION  L2-3: Pronounced bilateral facet arthropathy right  worse than left. Retrolisthesis of 3 mm. Stenosis of both lateral recesses and foramina, right more than left. Neural compression could occur at this level, particularly on the right. The facet arthropathy could be painful.  L3-4: Pronounced bilateral facet arthropathy right worse than left. Anterolisthesis of 3 mm. Shallow broad-based protrusion of disc material. Stenosis of both lateral recesses and foramina that could cause neural compression. The bilateral facet arthropathy could cause regional pain.  L4-5: Disc bulge. Bilateral facet degeneration and hypertrophy. Mild stenosis of both lateral recesses and foramina. The facet arthropathy could be a source of pain.   Electronically Signed   By: Paulina Fusi M.D.   On: 01/04/2014 19:26   Dg Pelvis Portable  01/04/2014   CLINICAL DATA:  Arthritis and pelvic pain.  EXAM: PORTABLE PELVIS 1-2 VIEWS  COMPARISON:  None.  FINDINGS: Degenerative changes are seen in the lower lumbar spine and both hips. There is no evidence for acute fracture or dislocation. There is moderate stool throughout colonic loops.  IMPRESSION: No evidence for acute  abnormality.  Degenerative changes.   Electronically Signed   By: Rosalie Gums M.D.   On: 01/04/2014 01:19    Microbiology: No results found for this or any previous visit (from the past 240 hour(s)).   Labs: Basic Metabolic Panel:  Recent Labs Lab 01/03/14 2118 01/04/14 0410 01/07/14 0405  NA 134* 134*  --   K 5.1 4.3  --   CL 99 100  --   CO2 22 21  --   GLUCOSE 88 112*  --   BUN 19 18  --   CREATININE 1.32 1.29 1.35  CALCIUM 9.5 8.9  --    Liver Function Tests:  Recent Labs Lab  01/03/14 2118 01/04/14 0410  AST 32 26  ALT 14 11  ALKPHOS 66 58  BILITOT 0.3 0.2*  PROT 8.5* 7.3  ALBUMIN 3.7 3.1*   No results found for this basename: LIPASE, AMYLASE,  in the last 168 hours No results found for this basename: AMMONIA,  in the last 168 hours CBC:  Recent Labs Lab 01/03/14 2118 01/04/14 0410  WBC 5.7 4.1  NEUTROABS 3.5 2.4  HGB 9.5* 8.8*  HCT 29.2* 28.0*  MCV 89.8 90.6  PLT 171 144*   Cardiac Enzymes: No results found for this basename: CKTOTAL, CKMB, CKMBINDEX, TROPONINI,  in the last 168 hours BNP: BNP (last 3 results) No results found for this basename: PROBNP,  in the last 8760 hours CBG: No results found for this basename: GLUCAP,  in the last 168 hours  Time coordinating discharge: Over 30 minutes

## 2014-01-07 NOTE — Discharge Instructions (Addendum)
Back Pain, Adult °Back pain is very common. The pain often gets better over time. The cause of back pain is usually not dangerous. Most people can learn to manage their back pain on their own.  °HOME CARE  °· Stay active. Start with short walks on flat ground if you can. Try to walk farther each day. °· Do not sit, drive, or stand in one place for more than 30 minutes. Do not stay in bed. °· Do not avoid exercise or work. Activity can help your back heal faster. °· Be careful when you bend or lift an object. Bend at your knees, keep the object close to you, and do not twist. °· Sleep on a firm mattress. Lie on your side, and bend your knees. If you lie on your back, put a pillow under your knees. °· Only take medicines as told by your doctor. °· Put ice on the injured area. °¨ Put ice in a plastic bag. °¨ Place a towel between your skin and the bag. °¨ Leave the ice on for 15-20 minutes, 03-04 times a day for the first 2 to 3 days. After that, you can switch between ice and heat packs. °· Ask your doctor about back exercises or massage. °· Avoid feeling anxious or stressed. Find good ways to deal with stress, such as exercise. °GET HELP RIGHT AWAY IF:  °· Your pain does not go away with rest or medicine. °· Your pain does not go away in 1 week. °· You have new problems. °· You do not feel well. °· The pain spreads into your legs. °· You cannot control when you poop (bowel movement) or pee (urinate). °· Your arms or legs feel weak or lose feeling (numbness). °· You feel sick to your stomach (nauseous) or throw up (vomit). °· You have belly (abdominal) pain. °· You feel like you may pass out (faint). °MAKE SURE YOU:  °· Understand these instructions. °· Will watch your condition. °· Will get help right away if you are not doing well or get worse. °Document Released: 09/22/2007 Document Revised: 06/28/2011 Document Reviewed: 08/07/2013 °Exit[employer]® Patient Information ©2015 [employer]. This information is not intended  to replace advice given to you by your health care provider. Make sure you discuss any questions you have with your health care provider. ° °

## 2014-01-07 NOTE — Progress Notes (Signed)
CSW continuing to follow for disposition planning.  CSW met with pt at bedside. Pt deferring decisions to pt granddaughter, Kerman Passey at this time. Pt reports that he lives with his daughters who has medical needs of her own and pt prefers short term rehab. CSW explained that pt has Medicare and in the hospital as observation therefore rehab at SNF would be private pay. Pt expressed understanding and stated that pt granddaughter, Kerman Passey will be able to make decision regarding SNF for rehab as private pay.  CSW contacted pt granddaughter, Kerman Passey via telephone. CSW introduced self and explained role. CSW discussed with pt granddaughter that [employer] responded and felt that they could meet pt medical needs. CSW discussed private pay cost for [facility] with pt granddaughter. CSW clarified pt granddaughter questions surrounding home health vs rehab at [facility]. Pt granddaughter discussed with pt daughter and pt family agreeable to privately paying for [facility] for one week and then make decision from there regarding continuing to pay privately at [facility] returning home with home health services.  CSW spoke with [facility] and confirmed that [facility] agreeable to accepting pt as private pay for one week.   Per MD, pt medically ready for discharge today. [facility] confirmed bed availability for today. CSW notified MD.   CSW to facilitate pt discharge needs when pt medically ready for discharge.  Alison Murray, MSW, [place] [employer] [phone]

## 2014-01-09 ENCOUNTER — Non-Acute Institutional Stay (SKILLED_NURSING_FACILITY): Payer: Medicare Other | Admitting: Adult Health

## 2014-01-09 DIAGNOSIS — E785 Hyperlipidemia, unspecified: Secondary | ICD-10-CM

## 2014-01-09 DIAGNOSIS — D649 Anemia, unspecified: Secondary | ICD-10-CM

## 2014-01-09 DIAGNOSIS — M545 Low back pain, unspecified: Secondary | ICD-10-CM

## 2014-01-09 DIAGNOSIS — I10 Essential (primary) hypertension: Secondary | ICD-10-CM

## 2014-01-09 DIAGNOSIS — J309 Allergic rhinitis, unspecified: Secondary | ICD-10-CM

## 2014-01-10 ENCOUNTER — Other Ambulatory Visit: Payer: Self-pay | Admitting: *Deleted

## 2014-01-10 ENCOUNTER — Encounter: Payer: Self-pay | Admitting: Adult Health

## 2014-01-10 ENCOUNTER — Non-Acute Institutional Stay (SKILLED_NURSING_FACILITY): Payer: Medicare Other | Admitting: Internal Medicine

## 2014-01-10 DIAGNOSIS — E785 Hyperlipidemia, unspecified: Secondary | ICD-10-CM

## 2014-01-10 DIAGNOSIS — M545 Low back pain, unspecified: Secondary | ICD-10-CM

## 2014-01-10 DIAGNOSIS — I10 Essential (primary) hypertension: Secondary | ICD-10-CM

## 2014-01-10 DIAGNOSIS — Z9861 Coronary angioplasty status: Secondary | ICD-10-CM

## 2014-01-10 DIAGNOSIS — I2510 Atherosclerotic heart disease of native coronary artery without angina pectoris: Secondary | ICD-10-CM

## 2014-01-10 MED ORDER — OXYCODONE HCL 5 MG PO TABS: ORAL_TABLET | ORAL | Status: DC

## 2014-01-10 NOTE — Telephone Encounter (Signed)
[facility] 

## 2014-01-10 NOTE — Progress Notes (Signed)
Patient ID: Luke Sanchez, male   DOB: 10/14/1914, 80 y.o.   MRN: [id number]               PROGRESS NOTE  DATE: 01/09/2014  FACILITY: Nursing Home Location: [facility]  LEVEL OF CARE: SNF (31)  Acute Visit  CHIEF COMPLAINT:  New Admit to SNF  HISTORY OF PRESENT ILLNESS:  This is a 80 year old male who has been admitted to [facility] on 01/07/14 from [facility] with Low back pain. He has been admitted for a short-term rehabilitation.  REASSESSMENT OF ONGOING PROBLEM(S):  HTN: Pt 's HTN remains stable.  Denies CP, sob, DOE, pedal edema, headaches, dizziness or visual disturbances.  No complications from the medications currently being used.  Last BP : 134/71  HYPERLIPIDEMIA: No complications from the medications presently being used. Last fasting lipid panel showed : is not available  ALLERGIC RHINITIS: Allergic rhinitis remains stable.  Patient denies ongoing symptoms such as runny nose sneezing or tearing. No complications reported from the current medication(s) being used.  PAST MEDICAL HISTORY : Reviewed.  No changes/see problem list  CURRENT MEDICATIONS: Reviewed per MAR/see medication list  REVIEW OF SYSTEMS:  GENERAL: no change in appetite, no fatigue, no weight changes, no fever, chills or weakness RESPIRATORY: no cough, SOB, DOE, wheezing, hemoptysis CARDIAC: no chest pain, edema or palpitations GI: no abdominal pain, diarrhea, constipation, heart burn, nausea or vomiting  PHYSICAL EXAMINATION  GENERAL: no acute distress, normal body habitus EYES: conjunctivae normal, sclerae normal, normal eye lids NECK: supple, trachea midline, no neck masses, no thyroid tenderness, no thyromegaly LYMPHATICS: no LAN in the neck, no supraclavicular LAN RESPIRATORY: breathing is even & unlabored, BS CTAB CARDIAC: RRR, no edema, +murmur GI: abdomen soft, normal BS, no masses, no tenderness, no hepatomegaly, no splenomegaly EXTREMITIES:  Able to move all 4  extremities PSYCHIATRIC: the patient is alert & oriented to person, affect & behavior appropriate  LABS/RADIOLOGY: Labs reviewed: Basic Metabolic Panel:  Recent Labs  40/98/11 2118 01/04/14 0410 01/07/14 0405  NA 134* 134*  --   K 5.1 4.3  --   CL 99 100  --   CO2 22 21  --   GLUCOSE 88 112*  --   BUN 19 18  --   CREATININE 1.32 1.29 1.35  CALCIUM 9.5 8.9  --    Liver Function Tests:  Recent Labs  01/03/14 2118 01/04/14 0410  AST 32 26  ALT 14 11  ALKPHOS 66 58  BILITOT 0.3 0.2*  PROT 8.5* 7.3  ALBUMIN 3.7 3.1*   CBC:  Recent Labs  01/03/14 2118 01/04/14 0410  WBC 5.7 4.1  NEUTROABS 3.5 2.4  HGB 9.5* 8.8*  HCT 29.2* 28.0*  MCV 89.8 90.6  PLT 171 144*   EXAM: LUMBAR SPINE - COMPLETE 4+ VIEW   COMPARISON:  07/19/2005 radiographs   FINDINGS: There is no evidence of acute fracture or subluxation.   Mild to moderate multilevel degenerative disc disease, spondylosis and facet arthropathy again identified.   No focal bony lesions or spondylolysis identified.   There has been little change since prior study.   IMPRESSION: No evidence of acute bony abnormality.   Mild to moderate multilevel degenerative changes. EXAM: CHEST  2 VIEW   COMPARISON:  Chest radiograph performed 10/08/2012   FINDINGS: There is elevation of the right hemidiaphragm. Minimal left-sided atelectasis or scarring is seen. Mild vascular congestion is noted. No pleural effusion or pneumothorax is identified.   The  heart is normal in size; the mediastinal contour is within normal limits. No acute osseous abnormalities are seen.   IMPRESSION: Elevation of the right hemidiaphragm. Minimal left-sided atelectasis or scarring noted. Mild vascular congestion seen.   EXAM: PORTABLE PELVIS 1-2 VIEWS   COMPARISON:  None.   FINDINGS: Degenerative changes are seen in the lower lumbar spine and both hips. There is no evidence for acute fracture or dislocation. There is moderate  stool throughout colonic loops.   IMPRESSION: No evidence for acute  abnormality.   Degenerative changes. EXAM: MRI THORACIC AND LUMBAR SPINE WITHOUT CONTRAST   TECHNIQUE: Multiplanar and multiecho pulse sequences of the thoracic and lumbar spine were obtained without intravenous contrast.   COMPARISON:  Radiography 01/03/2014.   FINDINGS: MR THORACIC SPINE FINDINGS   Scout view in the cervical region for counting purposes does not show any advanced disease.   T1-2:  Mild bulging of the disc.  No canal stenosis.   Below that, the intervertebral discs appear normal without bulge or herniation. The spinal canal is widely patent with ample subarachnoid space surrounding the cord. 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 This could be a cause of back ache. There is root sleeve ectasia at a number of levels. This is a normal variant and not of any clinical significance. There is no evidence of thoracic cord abnormality.   MR LUMBAR SPINE FINDINGS   L1-2: Mild bulging of the disc. Mild facet and ligamentous hypertrophy. No compressive stenosis. Conus tip at upper L1.   L2-3: Retrolisthesis of 3 mm. Endplate osteophytes and bulging of the disc. Pronounced bilateral facet degeneration and hypertrophy, right worse than left. Stenosis of both lateral recesses and foramina, right worse than left. Neural compression could occur at this level, particularly on the right. The facet arthropathy could also be a cause of localized back pain.   L3-4: Anterolisthesis 3 mm. This is due to bilateral facet degeneration and hypertrophy, right more than left. There is shallow broad-based protrusion of disc material. There is stenosis of both lateral recesses and foramina, right more than left. This is not as severe as the L2-3 level. The facet arthropathy could be a cause of pain.   L4-5: Endplate  osteophytes and bulging of the disc. Bilateral facet degeneration without slippage. Mild stenosis of both lateral recesses and neural foramina without gross neural compression. The facet arthropathy could relate to low back pain.   L5-S1: Mild bulging of the disc. Bilateral facet degeneration. No compressive canal or foraminal stenosis.   IMPRESSION: MR THORACIC SPINE IMPRESSION   No significant finding in the thoracic region. Wide patency of the canal. No cord abnormality. Lower thoracic ordinary facet degeneration which could relate to lower thoracic back ache.   MR LUMBAR SPINE IMPRESSION   L2-3: Pronounced bilateral facet arthropathy right worse than left. Retrolisthesis of 3 mm. Stenosis of both lateral recesses and foramina, right more than left. Neural compression could occur at this level, particularly on the right. The facet arthropathy could be painful.   L3-4: Pronounced bilateral facet arthropathy right worse than left. Anterolisthesis of 3 mm. Shallow broad-based protrusion of disc material. Stenosis of both lateral recesses and foramina that could cause neural compression. The bilateral facet arthropathy could cause regional pain.   L4-5: Disc bulge. Bilateral facet degeneration and hypertrophy. Mild stenosis of both lateral recesses and foramina. The facet arthropathy could be a source of pain.  EXAM: MRI THORACIC AND LUMBAR SPINE WITHOUT CONTRAST   TECHNIQUE: Multiplanar and multiecho pulse sequences of the thoracic and lumbar spine were obtained without intravenous contrast.   COMPARISON:  Radiography 01/03/2014.   FINDINGS: MR THORACIC SPINE FINDINGS   Scout view in the cervical region for counting purposes does not show any advanced disease.   T1-2:  Mild bulging of the disc.  No canal stenosis.   Below that, the intervertebral discs appear normal without bulge or herniation. The spinal canal is widely patent with ample subarachnoid space  surrounding the cord. 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 This could be a cause of back ache. There is root sleeve ectasia at a number of levels. This is a normal variant and not of any clinical significance. There is no evidence of thoracic cord abnormality.   MR LUMBAR SPINE FINDINGS   L1-2: Mild bulging of the disc. Mild facet and ligamentous hypertrophy. No compressive stenosis. Conus tip at upper L1.   L2-3: Retrolisthesis of 3 mm. Endplate osteophytes and bulging of the disc. Pronounced bilateral facet degeneration and hypertrophy, right worse than left. Stenosis of both lateral recesses and foramina, right worse than left. Neural compression could occur at this level, particularly on the right. The facet arthropathy could also be a cause of localized back pain.   L3-4: Anterolisthesis 3 mm. This is due to bilateral facet degeneration and hypertrophy, right more than left. There is shallow broad-based protrusion of disc material. There is stenosis of both lateral recesses and foramina, right more than left. This is not as severe as the L2-3 level. The facet arthropathy could be a cause of pain.   L4-5: Endplate osteophytes and bulging of the disc. Bilateral facet degeneration without slippage. Mild stenosis of both lateral recesses and neural foramina without gross neural compression. The facet arthropathy could relate to low back pain.   L5-S1: Mild bulging of the disc. Bilateral facet degeneration. No compressive canal or foraminal stenosis.   IMPRESSION: MR THORACIC SPINE IMPRESSION   No significant finding in the thoracic region. Wide patency of the canal. No cord abnormality. Lower thoracic ordinary facet degeneration which could relate to lower thoracic back ache.   MR LUMBAR SPINE IMPRESSION   L2-3: Pronounced bilateral facet arthropathy right worse than  left. Retrolisthesis of 3 mm. Stenosis of both lateral recesses and foramina, right more than left. Neural compression could occur at this level, particularly on the right. The facet arthropathy could be painful.   L3-4: Pronounced bilateral facet arthropathy right worse than left. Anterolisthesis of 3 mm. Shallow broad-based protrusion of disc material. Stenosis of both lateral recesses and foramina that could cause neural compression. The bilateral facet arthropathy could cause regional pain.   L4-5: Disc bulge. Bilateral facet degeneration and hypertrophy. Mild stenosis of both lateral recesses and foramina. The facet arthropathy could be a source of pain.   ASSESSMENT/PLAN:  Low back pain - continue lumbar corset; for rehabilitation Hypertension - well-controlled; continue Norvasc and Toprol XL Hyperlipidemia - continue Lipitor Anemia - stable Allergic rhinitis - stable; continue Flonase    CPT CODE: 16109  Ella Bodo - NP [facility] [phone]

## 2014-01-12 NOTE — Progress Notes (Signed)
HISTORY & PHYSICAL  DATE: 01/10/2014   FACILITY: [facility]  LEVEL OF CARE: SNF (31)  ALLERGIES:  Allergies  Allergen Reactions  . Penicillins Anaphylaxis    CHIEF COMPLAINT:  Manage low back pain, hypertension and CAD  HISTORY OF PRESENT ILLNESS: 80 year old Caucasian male was hospitalized secondary to intractable low back pain. After hospitalization he admitted to this facility for short-term rehabilitation.  LOW BACK PAIN: Patient's low back pain remains stable.  Patient denies increasing back pain, stiffness, radiation, lower extremity weakness, or paresthesias.  No complications reported from the medication(s) presently being used. MRI of the lumbar and thoracic spine showed pronounced L2-3, L3-4 bilateral facet arthropathy right worse than left and disc bulge at L4-5. Neurosurgery placed lumbar corset and patient received epidural injection.  CAD: The angina has been stable. The patient denies dyspnea on exertion, orthopnea, pedal edema, palpitations and paroxysmal nocturnal dyspnea. No complications noted from the medication presently being used. He is status post stenting.  HTN: Pt 's HTN remains stable.  Denies CP, sob, DOE, pedal edema, headaches, dizziness or visual disturbances.  No complications from the medications currently being used.  Last BP : 149/80.  PAST MEDICAL HISTORY :  Past Medical History  Diagnosis Date  . Hypertension   . Bronchitis   . Arthritis   . Pneumonia     hx of  . CAD (coronary artery disease)     stent RCA 2010-[name]    PAST SURGICAL HISTORY: Past Surgical History  Procedure Laterality Date  . Coronary stent placement    . Hernia repair      x 2    SOCIAL HISTORY:  reports that he has never smoked. He has quit using smokeless tobacco. His smokeless tobacco use included Snuff and Chew. He reports that he does not drink alcohol or use illicit drugs.  FAMILY HISTORY: None  CURRENT MEDICATIONS:  Reviewed per MAR/see medication list  REVIEW OF SYSTEMS:  See HPI otherwise 14 point ROS is negative.  PHYSICAL EXAMINATION  VS:  See VS section  GENERAL: no acute distress, normal body habitus EYES: conjunctivae normal, sclerae normal, normal eye lids MOUTH/THROAT: lips without lesions,no lesions in the mouth,tongue is without lesions,uvula elevates in midline NECK: supple, trachea midline, no neck masses, no thyroid tenderness, no thyromegaly LYMPHATICS: no LAN in the neck, no supraclavicular LAN RESPIRATORY: breathing is even & unlabored, BS CTAB CARDIAC: RRR, no murmur,no extra heart sounds, no edema GI:  ABDOMEN: Unable to assess-has corset on LIVER/SPLEEN: Unable to assess-has corset on MUSCULOSKELETAL: HEAD: normal to inspection  EXTREMITIES: LEFT UPPER EXTREMITY: full range of motion, normal strength & tone RIGHT UPPER EXTREMITY:  full range of motion, normal strength & tone LEFT LOWER EXTREMITY: Minimal range of motion, normal strength & tone RIGHT LOWER EXTREMITY: Minimal range of motion, normal strength & tone PSYCHIATRIC: the patient is alert & oriented to person, affect & behavior appropriate  LABS/RADIOLOGY:  Labs reviewed: Basic Metabolic Panel:  Recent Labs  04/54/09 2118 01/04/14 0410 01/07/14 0405  NA 134* 134*  --   K 5.1 4.3  --   CL 99 100  --   CO2 22 21  --   GLUCOSE 88 112*  --   BUN 19 18  --   CREATININE 1.32 1.29 1.35  CALCIUM 9.5 8.9  --    Liver Function Tests:  Recent Labs  01/03/14 2118 01/04/14 0410  AST 32 26  ALT 14 11  ALKPHOS 66 58  BILITOT 0.3 0.2*  PROT 8.5* 7.3  ALBUMIN 3.7 3.1*   CBC:  Recent Labs  01/03/14 2118 01/04/14 0410  WBC 5.7 4.1  NEUTROABS 3.5 2.4  HGB 9.5* 8.8*  HCT 29.2* 28.0*  MCV 89.8 90.6  PLT 171 144*    LUMBAR SPINE - COMPLETE 4+ VIEW   COMPARISON:  07/19/2005 radiographs   FINDINGS: There is no evidence of acute fracture or subluxation.   Mild to moderate multilevel degenerative  disc disease, spondylosis and facet arthropathy again identified.   No focal bony lesions or spondylolysis identified.   There has been little change since prior study.   IMPRESSION: No evidence of acute bony abnormality.   Mild to moderate multilevel degenerative changes. COMPARISON:  Chest radiograph performed 10/08/2012   FINDINGS: There is elevation of the right hemidiaphragm. Minimal left-sided atelectasis or scarring is seen. Mild vascular congestion is noted. No pleural effusion or pneumothorax is identified.   The heart is normal in size; the mediastinal contour is within normal limits. No acute osseous abnormalities are seen.   IMPRESSION: Elevation of the right hemidiaphragm. Minimal left-sided atelectasis or scarring noted. Mild vascular congestion seen.   PORTABLE PELVIS 1-2 VIEWS   COMPARISON:  None.   FINDINGS: Degenerative changes are seen in the lower lumbar spine and both hips. There is no evidence for acute fracture or dislocation. There is moderate stool throughout colonic loops.   IMPRESSION: No evidence for acute  abnormality.   Degenerative changes. MRI THORACIC AND LUMBAR SPINE WITHOUT CONTRAST   TECHNIQUE: Multiplanar and multiecho pulse sequences of the thoracic and lumbar spine were obtained without intravenous contrast.   COMPARISON:  Radiography 01/03/2014.   FINDINGS: MR THORACIC SPINE FINDINGS   Scout view in the cervical region for counting purposes does not show any advanced disease.   T1-2:  Mild bulging of the disc.  No canal stenosis.   Below that, the intervertebral discs appear normal without bulge or herniation. The spinal canal is widely patent with ample subarachnoid space surrounding the cord. 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 This could be a cause of back ache. There is root sleeve ectasia at a number of levels. This is  a normal variant and not of any clinical significance. There is no evidence of thoracic cord abnormality.   MR LUMBAR SPINE FINDINGS   L1-2: Mild bulging of the disc. Mild facet and ligamentous hypertrophy. No compressive stenosis. Conus tip at upper L1.   L2-3: Retrolisthesis of 3 mm. Endplate osteophytes and bulging of the disc. Pronounced bilateral facet degeneration and hypertrophy, right worse than left. Stenosis of both lateral recesses and foramina, right worse than left. Neural compression could occur at this level, particularly on the right. The facet arthropathy could also be a cause of localized back pain.   L3-4: Anterolisthesis 3 mm. This is due to bilateral facet degeneration and hypertrophy, right more than left. There is shallow broad-based protrusion of disc material. There is stenosis of both lateral recesses and foramina, right more than left. This is not as severe as the L2-3 level. The facet arthropathy could be a cause of pain.   L4-5: Endplate osteophytes and bulging of the disc. Bilateral facet degeneration without slippage. Mild stenosis of both lateral recesses and neural foramina without gross neural compression. The facet arthropathy could relate to low back pain.   L5-S1: Mild bulging of the  disc. Bilateral facet degeneration. No compressive canal or foraminal stenosis.   IMPRESSION: MR THORACIC SPINE IMPRESSION   No significant finding in the thoracic region. Wide patency of the canal. No cord abnormality. Lower thoracic ordinary facet degeneration which could relate to lower thoracic back ache.   MR LUMBAR SPINE IMPRESSION   L2-3: Pronounced bilateral facet arthropathy right worse than left. Retrolisthesis of 3 mm. Stenosis of both lateral recesses and foramina, right more than left. Neural compression could occur at this level, particularly on the right. The facet arthropathy could be painful.   L3-4: Pronounced bilateral facet arthropathy  right worse than left. Anterolisthesis of 3 mm. Shallow broad-based protrusion of disc material. Stenosis of both lateral recesses and foramina that could cause neural compression. The bilateral facet arthropathy could cause regional pain.   L4-5: Disc bulge. Bilateral facet degeneration and hypertrophy. Mild stenosis of both lateral recesses and foramina. The facet arthropathy could be a source of pain.     ASSESSMENT/PLAN:  Low back pain- continue rehabilitation and pain management Hypertension -- blood pressure borderline. Will monitor. CAD-stable Hyperlipidemia-continue Crestor Allergic rhinitis-well-controlled BPH-continue Proscar GERD-continue dexilant Check CBC  I have reviewed patient's medical records received at admission/from hospitalization.  CPT CODE: 16109  Angela Cox, MD [facility] [phone]

## 2014-01-18 ENCOUNTER — Non-Acute Institutional Stay (SKILLED_NURSING_FACILITY): Payer: Medicare Other | Admitting: Adult Health

## 2014-01-18 ENCOUNTER — Encounter: Payer: Self-pay | Admitting: Adult Health

## 2014-01-18 DIAGNOSIS — N400 Enlarged prostate without lower urinary tract symptoms: Secondary | ICD-10-CM

## 2014-01-18 DIAGNOSIS — J309 Allergic rhinitis, unspecified: Secondary | ICD-10-CM

## 2014-01-18 DIAGNOSIS — I10 Essential (primary) hypertension: Secondary | ICD-10-CM

## 2014-01-18 DIAGNOSIS — M545 Low back pain: Secondary | ICD-10-CM

## 2014-01-18 DIAGNOSIS — E785 Hyperlipidemia, unspecified: Secondary | ICD-10-CM

## 2014-01-18 DIAGNOSIS — D649 Anemia, unspecified: Secondary | ICD-10-CM

## 2014-01-18 NOTE — Progress Notes (Signed)
Patient ID: Luke Sanchez, male   DOB: 1914-07-03, 80 y.o.   MRN: [id number]              PROGRESS NOTE  DATE:       01/18/2014  FACILITY: Nursing Home Location: [facility]  LEVEL OF CARE: SNF (31)  Discharge Notes  HISTORY OF PRESENT ILLNESS:  This is a 80 year old male who is for discharge home with home health PT, OT, ST and home health aide. DME:  Wheelchair  16' X 18" deep with elevating leg rests, anti-tippers and cushion. He has been admitted to [facility] on 01/07/14 from [facility] with Low back pain. Patient was admitted to this facility for short-term rehabilitation after the patient's recent hospitalization.  Patient has completed SNF rehabilitation and therapy has cleared the patient for discharge.  REASSESSMENT OF ONGOING PROBLEM(S):  HTN: Pt 's HTN remains stable.  Denies CP, sob, DOE, pedal edema, headaches, dizziness or visual disturbances.  No complications from the medications currently being used.  Last BP : 125/71  BPH: The patient's BPH remains stable. Patient denies urinary hesitancy or dribbling. No complications reported from the current medications being used.  ANEMIA: The anemia has been stable. The patient denies fatigue, melena or hematochezia. No complications from the medications currently being used. 9/15 hgb 8.9  PAST MEDICAL HISTORY : Reviewed.  No changes/see problem list  CURRENT MEDICATIONS: Reviewed per MAR/see medication list  REVIEW OF SYSTEMS:  GENERAL: no change in appetite, no fatigue, no weight changes, no fever, chills or weakness RESPIRATORY: no cough, SOB, DOE, wheezing, hemoptysis CARDIAC: no chest pain, edema or palpitations GI: no abdominal pain, diarrhea, constipation, heart burn, nausea or vomiting  PHYSICAL EXAMINATION  GENERAL: no acute distress, normal body habitus NECK: supple, trachea midline, no neck masses, no thyroid tenderness, no thyromegaly LYMPHATICS: no LAN in the neck, no supraclavicular  LAN RESPIRATORY: breathing is even & unlabored, BS CTAB CARDIAC: RRR, no edema, +murmur GI: abdomen soft, normal BS, no masses, no tenderness, no hepatomegaly, no splenomegaly EXTREMITIES:  Able to move all 4 extremities PSYCHIATRIC: the patient is alert & oriented to person, affect & behavior appropriate  LABS/RADIOLOGY: 01/11/14  WBC 6.3 hemoglobin 8.9 hematocrit 29.4 MCV 94.5 Labs reviewed: Basic Metabolic Panel:  Recent Labs  40/98/11 2118 01/04/14 0410 01/07/14 0405  NA 134* 134*  --   K 5.1 4.3  --   CL 99 100  --   CO2 22 21  --   GLUCOSE 88 112*  --   BUN 19 18  --   CREATININE 1.32 1.29 1.35  CALCIUM 9.5 8.9  --    Liver Function Tests:  Recent Labs  01/03/14 2118 01/04/14 0410  AST 32 26  ALT 14 11  ALKPHOS 66 58  BILITOT 0.3 0.2*  PROT 8.5* 7.3  ALBUMIN 3.7 3.1*   CBC:  Recent Labs  01/03/14 2118 01/04/14 0410  WBC 5.7 4.1  NEUTROABS 3.5 2.4  HGB 9.5* 8.8*  HCT 29.2* 28.0*  MCV 89.8 90.6  PLT 171 144*   EXAM: LUMBAR SPINE - COMPLETE 4+ VIEW   COMPARISON:  07/19/2005 radiographs   FINDINGS: There is no evidence of acute fracture or subluxation.   Mild to moderate multilevel degenerative disc disease, spondylosis and facet arthropathy again identified.   No focal bony lesions or spondylolysis identified.   There has been little change since prior study.   IMPRESSION: No evidence of acute bony abnormality.   Mild to  moderate multilevel degenerative changes. EXAM: CHEST  2 VIEW   COMPARISON:  Chest radiograph performed 10/08/2012   FINDINGS: There is elevation of the right hemidiaphragm. Minimal left-sided atelectasis or scarring is seen. Mild vascular congestion is noted. No pleural effusion or pneumothorax is identified.   The heart is normal in size; the mediastinal contour is within normal limits. No acute osseous abnormalities are seen.   IMPRESSION: Elevation of the right hemidiaphragm. Minimal left-sided atelectasis or  scarring noted. Mild vascular congestion seen.   EXAM: PORTABLE PELVIS 1-2 VIEWS   COMPARISON:  None.   FINDINGS: Degenerative changes are seen in the lower lumbar spine and both hips. There is no evidence for acute fracture or dislocation. There is moderate stool throughout colonic loops.   IMPRESSION: No evidence for acute  abnormality.   Degenerative changes. EXAM: MRI THORACIC AND LUMBAR SPINE WITHOUT CONTRAST   TECHNIQUE: Multiplanar and multiecho pulse sequences of the thoracic and lumbar spine were obtained without intravenous contrast.   COMPARISON:  Radiography 01/03/2014.   FINDINGS: MR THORACIC SPINE FINDINGS   Scout view in the cervical region for counting purposes does not show any advanced disease.   T1-2:  Mild bulging of the disc.  No canal stenosis.   Below that, the intervertebral discs appear normal without bulge or herniation. The spinal canal is widely patent with ample subarachnoid space surrounding the cord. 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 This could be a cause of back ache. There is root sleeve ectasia at a number of levels. This is a normal variant and not of any clinical significance. There is no evidence of thoracic cord abnormality.   MR LUMBAR SPINE FINDINGS   L1-2: Mild bulging of the disc. Mild facet and ligamentous hypertrophy. No compressive stenosis. Conus tip at upper L1.   L2-3: Retrolisthesis of 3 mm. Endplate osteophytes and bulging of the disc. Pronounced bilateral facet degeneration and hypertrophy, right worse than left. Stenosis of both lateral recesses and foramina, right worse than left. Neural compression could occur at this level, particularly on the right. The facet arthropathy could also be a cause of localized back pain.   L3-4: Anterolisthesis 3 mm. This is due to bilateral facet degeneration and hypertrophy, right  more than left. There is shallow broad-based protrusion of disc material. There is stenosis of both lateral recesses and foramina, right more than left. This is not as severe as the L2-3 level. The facet arthropathy could be a cause of pain.   L4-5: Endplate osteophytes and bulging of the disc. Bilateral facet degeneration without slippage. Mild stenosis of both lateral recesses and neural foramina without gross neural compression. The facet arthropathy could relate to low back pain.   L5-S1: Mild bulging of the disc. Bilateral facet degeneration. No compressive canal or foraminal stenosis.   IMPRESSION: MR THORACIC SPINE IMPRESSION   No significant finding in the thoracic region. Wide patency of the canal. No cord abnormality. Lower thoracic ordinary facet degeneration which could relate to lower thoracic back ache.   MR LUMBAR SPINE IMPRESSION   L2-3: Pronounced bilateral facet arthropathy right worse than left. Retrolisthesis of 3 mm. Stenosis of both lateral recesses and foramina, right more than left. Neural compression could occur at this level, particularly on the right. The facet arthropathy could be painful.   L3-4: Pronounced bilateral facet arthropathy right worse than left. Anterolisthesis of 3 mm. Shallow broad-based protrusion of  disc material. Stenosis of both lateral recesses and foramina that could cause neural compression. The bilateral facet arthropathy could cause regional pain.   L4-5: Disc bulge. Bilateral facet degeneration and hypertrophy. Mild stenosis of both lateral recesses and foramina. The facet arthropathy could be a source of pain. EXAM: MRI THORACIC AND LUMBAR SPINE WITHOUT CONTRAST   TECHNIQUE: Multiplanar and multiecho pulse sequences of the thoracic and lumbar spine were obtained without intravenous contrast.   COMPARISON:  Radiography 01/03/2014.   FINDINGS: MR THORACIC SPINE FINDINGS   Scout view in the cervical region for  counting purposes does not show any advanced disease.   T1-2:  Mild bulging of the disc.  No canal stenosis.   Below that, the intervertebral discs appear normal without bulge or herniation. The spinal canal is widely patent with ample subarachnoid space surrounding the cord. 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 This could be a cause of back ache. There is root sleeve ectasia at a number of levels. This is a normal variant and not of any clinical significance. There is no evidence of thoracic cord abnormality.   MR LUMBAR SPINE FINDINGS   L1-2: Mild bulging of the disc. Mild facet and ligamentous hypertrophy. No compressive stenosis. Conus tip at upper L1.   L2-3: Retrolisthesis of 3 mm. Endplate osteophytes and bulging of the disc. Pronounced bilateral facet degeneration and hypertrophy, right worse than left. Stenosis of both lateral recesses and foramina, right worse than left. Neural compression could occur at this level, particularly on the right. The facet arthropathy could also be a cause of localized back pain.   L3-4: Anterolisthesis 3 mm. This is due to bilateral facet degeneration and hypertrophy, right more than left. There is shallow broad-based protrusion of disc material. There is stenosis of both lateral recesses and foramina, right more than left. This is not as severe as the L2-3 level. The facet arthropathy could be a cause of pain.   L4-5: Endplate osteophytes and bulging of the disc. Bilateral facet degeneration without slippage. Mild stenosis of both lateral recesses and neural foramina without gross neural compression. The facet arthropathy could relate to low back pain.   L5-S1: Mild bulging of the disc. Bilateral facet degeneration. No compressive canal or foraminal stenosis.   IMPRESSION: MR THORACIC SPINE IMPRESSION   No significant finding in the  thoracic region. Wide patency of the canal. No cord abnormality. Lower thoracic ordinary facet degeneration which could relate to lower thoracic back ache.   MR LUMBAR SPINE IMPRESSION   L2-3: Pronounced bilateral facet arthropathy right worse than left. Retrolisthesis of 3 mm. Stenosis of both lateral recesses and foramina, right more than left. Neural compression could occur at this level, particularly on the right. The facet arthropathy could be painful.   L3-4: Pronounced bilateral facet arthropathy right worse than left. Anterolisthesis of 3 mm. Shallow broad-based protrusion of disc material. Stenosis of both lateral recesses and foramina that could cause neural compression. The bilateral facet arthropathy could cause regional pain.   L4-5: Disc bulge. Bilateral facet degeneration and hypertrophy. Mild stenosis of both lateral recesses and foramina. The facet arthropathy could be a source of pain.   ASSESSMENT/PLAN:  Low back pain - continue lumbar corset; for home health PT, OT, ST and home health aide Hypertension - well-controlled; continue Norvasc and Toprol XL Hyperlipidemia - continue Lipitor Anemia - stable Allergic rhinitis - stable; continue Flonase BPH -  continue Proscar   I have filled out patient's discharge paperwork and written prescriptions.  Patient will receive home health PT, OT, ST and home health aide.  DME provided: Wheelchair 16" X 18" deep with elevating leg rests, anti-tippers and cushion.  Total discharge time: Greater than 30 minutes  Discharge time involved coordination of the discharge process with social worker, nursing staff and therapy department. Medical justification for home health services/DME verified.   CPT CODE: 16109  Ella Bodo - NP [facility] [phone]

## 2014-01-25 DIAGNOSIS — R5381 Other malaise: Secondary | ICD-10-CM

## 2014-01-25 DIAGNOSIS — R1310 Dysphagia, unspecified: Secondary | ICD-10-CM

## 2014-01-25 DIAGNOSIS — M545 Low back pain: Secondary | ICD-10-CM

## 2014-01-25 DIAGNOSIS — G894 Chronic pain syndrome: Secondary | ICD-10-CM

## 2014-03-09 ENCOUNTER — Emergency Department (HOSPITAL_COMMUNITY)
Admission: EM | Admit: 2014-03-09 | Discharge: 2014-03-09 | Disposition: A | Discharge status: Home / Self Care | Payer: Medicare Other | Attending: Emergency Medicine | Admitting: Emergency Medicine

## 2014-03-09 ENCOUNTER — Encounter (HOSPITAL_COMMUNITY): Payer: Self-pay

## 2014-03-09 DIAGNOSIS — I2510 Atherosclerotic heart disease of native coronary artery without angina pectoris: Secondary | ICD-10-CM | POA: Insufficient documentation

## 2014-03-09 DIAGNOSIS — Z9861 Coronary angioplasty status: Secondary | ICD-10-CM | POA: Insufficient documentation

## 2014-03-09 DIAGNOSIS — Z79899 Other long term (current) drug therapy: Secondary | ICD-10-CM | POA: Diagnosis not present

## 2014-03-09 DIAGNOSIS — Z87440 Personal history of urinary (tract) infections: Secondary | ICD-10-CM | POA: Diagnosis not present

## 2014-03-09 DIAGNOSIS — Z8709 Personal history of other diseases of the respiratory system: Secondary | ICD-10-CM | POA: Diagnosis not present

## 2014-03-09 DIAGNOSIS — Z7952 Long term (current) use of systemic steroids: Secondary | ICD-10-CM | POA: Insufficient documentation

## 2014-03-09 DIAGNOSIS — R300 Dysuria: Secondary | ICD-10-CM | POA: Insufficient documentation

## 2014-03-09 DIAGNOSIS — N400 Enlarged prostate without lower urinary tract symptoms: Secondary | ICD-10-CM | POA: Diagnosis not present

## 2014-03-09 DIAGNOSIS — Z880 Allergy status to penicillin: Secondary | ICD-10-CM | POA: Insufficient documentation

## 2014-03-09 DIAGNOSIS — I10 Essential (primary) hypertension: Secondary | ICD-10-CM | POA: Insufficient documentation

## 2014-03-09 DIAGNOSIS — Z8701 Personal history of pneumonia (recurrent): Secondary | ICD-10-CM | POA: Insufficient documentation

## 2014-03-09 DIAGNOSIS — M1990 Unspecified osteoarthritis, unspecified site: Secondary | ICD-10-CM | POA: Diagnosis not present

## 2014-03-09 DIAGNOSIS — Z7951 Long term (current) use of inhaled steroids: Secondary | ICD-10-CM | POA: Insufficient documentation

## 2014-03-09 HISTORY — DX: Urinary tract infection, site not specified: N39.0

## 2014-03-09 LAB — URINALYSIS, ROUTINE W REFLEX MICROSCOPIC
BILIRUBIN URINE: NEGATIVE
Glucose, UA: NEGATIVE mg/dL
Hgb urine dipstick: NEGATIVE
Ketones, ur: NEGATIVE mg/dL
LEUKOCYTES UA: NEGATIVE
Nitrite: NEGATIVE
Protein, ur: NEGATIVE mg/dL
Specific Gravity, Urine: 1.01 (ref 1.005–1.030)
UROBILINOGEN UA: 0.2 mg/dL (ref 0.0–1.0)
pH: 6 (ref 5.0–8.0)

## 2014-03-09 LAB — CBC WITH DIFFERENTIAL/PLATELET
BASOS ABS: 0 10*3/uL (ref 0.0–0.1)
BASOS PCT: 0 % (ref 0–1)
Eosinophils Absolute: 0.1 10*3/uL (ref 0.0–0.7)
Eosinophils Relative: 1 % (ref 0–5)
HCT: 33.3 % — ABNORMAL LOW (ref 39.0–52.0)
Hemoglobin: 10.3 g/dL — ABNORMAL LOW (ref 13.0–17.0)
LYMPHS PCT: 16 % (ref 12–46)
Lymphs Abs: 0.8 10*3/uL (ref 0.7–4.0)
MCH: 30 pg (ref 26.0–34.0)
MCHC: 30.9 g/dL (ref 30.0–36.0)
MCV: 97.1 fL (ref 78.0–100.0)
MONO ABS: 0.8 10*3/uL (ref 0.1–1.0)
Monocytes Relative: 17 % — ABNORMAL HIGH (ref 3–12)
NEUTROS ABS: 3.2 10*3/uL (ref 1.7–7.7)
NEUTROS PCT: 66 % (ref 43–77)
PLATELETS: 155 10*3/uL (ref 150–400)
RBC: 3.43 MIL/uL — ABNORMAL LOW (ref 4.22–5.81)
RDW: 17.8 % — AB (ref 11.5–15.5)
WBC: 4.9 10*3/uL (ref 4.0–10.5)

## 2014-03-09 LAB — COMPREHENSIVE METABOLIC PANEL
ALK PHOS: 55 U/L (ref 39–117)
ALT: 12 U/L (ref 0–53)
AST: 19 U/L (ref 0–37)
Albumin: 3.3 g/dL — ABNORMAL LOW (ref 3.5–5.2)
Anion gap: 15 (ref 5–15)
BILIRUBIN TOTAL: 0.2 mg/dL — AB (ref 0.3–1.2)
BUN: 19 mg/dL (ref 6–23)
CO2: 23 meq/L (ref 19–32)
Calcium: 9.5 mg/dL (ref 8.4–10.5)
Chloride: 106 mEq/L (ref 96–112)
Creatinine, Ser: 1.32 mg/dL (ref 0.50–1.35)
GFR calc non Af Amer: 43 mL/min — ABNORMAL LOW (ref >90)
GFR, EST AFRICAN AMERICAN: 50 mL/min — AB (ref >90)
GLUCOSE: 96 mg/dL (ref 70–99)
POTASSIUM: 4.7 meq/L (ref 3.7–5.3)
SODIUM: 144 meq/L (ref 137–147)
Total Protein: 7.6 g/dL (ref 6.0–8.3)

## 2014-03-09 LAB — I-STAT CG4 LACTIC ACID, ED: Lactic Acid, Venous: 1.58 mmol/L (ref 0.5–2.2)

## 2014-03-09 MED ORDER — TAMSULOSIN HCL 0.4 MG PO CAPS
0.4000 mg | ORAL_CAPSULE | Freq: Once | ORAL | Status: AC
  Administered 2014-03-09: 0.4 mg via ORAL
  Filled 2014-03-09: qty 1

## 2014-03-09 MED ORDER — SILODOSIN 8 MG PO CAPS: 8.0000 mg | ORAL_CAPSULE | Freq: Every day | ORAL | Status: AC

## 2014-03-09 NOTE — ED Notes (Signed)
Per EMS- Patient lives at home with his daughter. Patient has had dysuria x 2 days. Patient has a history of the same. Patient is ambulatory with a walker. Alert and oriented x 4.

## 2014-03-09 NOTE — Discharge Instructions (Signed)
As discussed, your evaluation today has been largely reassuring.  But, it is important that you monitor your condition carefully, and do not hesitate to return to the ED if you develop new, or concerning changes in your condition.  Today we have re-started one of your medications that should have with your urinary symptoms.  Please be sure to discuss this with your physicians.  Otherwise, please follow-up with your physician for appropriate ongoing care.   Dysuria Dysuria is the medical term for pain with urination. There are many causes for dysuria, but urinary tract infection is the most common. If a urinalysis was performed it can show that there is a urinary tract infection. A urine culture confirms that you or your child is sick. You will need to follow up with a healthcare provider because:  If a urine culture was done you will need to know the culture results and treatment recommendations.  If the urine culture was positive, you or your child will need to be put on antibiotics or know if the antibiotics prescribed are the right antibiotics for your urinary tract infection.  If the urine culture is negative (no urinary tract infection), then other causes may need to be explored or antibiotics need to be stopped. Today laboratory work may have been done and there does not seem to be an infection. If cultures were done they will take at least 24 to 48 hours to be completed. Today x-rays may have been taken and they read as normal. No cause can be found for the problems. The x-rays may be re-read by a radiologist and you will be contacted if additional findings are made. You or your child may have been put on medications to help with this problem until you can see your primary caregiver. If the problems get better, see your primary caregiver if the problems return. If you were given antibiotics (medications which kill germs), take all of the mediations as directed for the full course of  treatment.  If laboratory work was done, you need to find the results. Leave a telephone number where you can be reached. If this is not possible, make sure you find out how you are to get test results. HOME CARE INSTRUCTIONS   Drink lots of fluids. For adults, drink eight, 8 ounce glasses of clear juice or water a day. For children, replace fluids as suggested by your caregiver.  Empty the bladder often. Avoid holding urine for long periods of time.  After a bowel movement, women should cleanse front to back, using each tissue only once.  Empty your bladder before and after sexual intercourse.  Take all the medicine given to you until it is gone. You may feel better in a few days, but TAKE ALL MEDICINE.  Avoid caffeine, tea, alcohol and carbonated beverages, because they tend to irritate the bladder.  In men, alcohol may irritate the prostate.  Only take over-the-counter or prescription medicines for pain, discomfort, or fever as directed by your caregiver.  If your caregiver has given you a follow-up appointment, it is very important to keep that appointment. Not keeping the appointment could result in a chronic or permanent injury, pain, and disability. If there is any problem keeping the appointment, you must call back to this facility for assistance. SEEK IMMEDIATE MEDICAL CARE IF:   Back pain develops.  A fever develops.  There is nausea (feeling sick to your stomach) or vomiting (throwing up).  Problems are no better with medications or are  getting worse. MAKE SURE YOU:   Understand these instructions.  Will watch your condition.  Will get help right away if you are not doing well or get worse. Document Released: 01/02/2004 Document Revised: 06/28/2011 Document Reviewed: 11/09/2007 [facility] Patient Information 2015 [employer], [place]. This information is not intended to replace advice given to you by your health care provider. Make sure you discuss any questions you have  with your health care provider.

## 2014-03-09 NOTE — ED Provider Notes (Signed)
CSN: [id number]     Arrival date & time 03/09/14  1306 History   First MD Initiated Contact with Patient 03/09/14 1323     Chief Complaint  Patient presents with  . Dysuria      HPI  The patient p/w dysuria and difficulty initiation urination.  Onset was several days ago. Since onset Sx have become worse, with no clear alleviating or exacerbating factors. No f/c, n/v/d, no confusion or disorientation. He acknowledges a history of prostate issues, denies other substantial medical problems.   Past Medical History  Diagnosis Date  . Hypertension   . Bronchitis   . Arthritis   . Pneumonia     hx of  . CAD (coronary artery disease)     stent RCA 2010-[name]  . UTI (lower urinary tract infection)    Past Surgical History  Procedure Laterality Date  . Coronary stent placement    . Hernia repair      x 2   History reviewed. No pertinent family history. History  Substance Use Topics  . Smoking status: Never Smoker   . Smokeless tobacco: Former NeurosurgeonUser    Types: Snuff, Chew  . Alcohol Use: No    Review of Systems  Constitutional:       Per HPI, otherwise negative  HENT:       Per HPI, otherwise negative  Respiratory:       Per HPI, otherwise negative  Cardiovascular:       Per HPI, otherwise negative  Gastrointestinal: Negative for vomiting.  Endocrine:       Negative aside from HPI  Genitourinary:       Neg aside from HPI   Musculoskeletal:       Per HPI, otherwise negative  Skin: Negative.   Neurological: Negative for syncope.      Allergies  Penicillins  Home Medications   Prior to Admission medications   Medication Sig Start Date End Date Taking? Authorizing Provider  acetaminophen (TYLENOL) 325 MG tablet Take 2 tablets (650 mg total) by mouth every 6 (six) hours as needed for mild pain (or Fever >/= 101). 01/07/14   [name], MD  amLODipine (NORVASC) 5 MG tablet Take 2 tablets (10 mg total) by mouth daily. 06/19/12   [name], MD  ARTIFICIAL  TEAR OP Apply 1 drop to eye daily as needed. For dry eyes    Historical Provider, MD  dexlansoprazole (DEXILANT) 60 MG capsule Take 60 mg by mouth daily with breakfast.    Historical Provider, MD  finasteride (PROSCAR) 5 MG tablet Take 5 mg by mouth daily.     Historical Provider, MD  fluticasone (FLONASE) 50 MCG/ACT nasal spray Place 2 sprays into the nose daily. 02/11/13   [name], MD  loperamide (IMODIUM A-D) 2 MG tablet Take 1 tablet (2 mg total) by mouth 4 (four) times daily as needed for diarrhea or loose stools. 06/19/12   [name], MD  metoprolol (TOPROL-XL) 50 MG 24 hr tablet Take 50 mg by mouth daily.     Historical Provider, MD  oxyCODONE (OXY IR/ROXICODONE) 5 MG immediate release tablet Take one tablet by mouth every 4 hours as needed for mild to moderate pain; Take two tablets by mouth every 4 hours as needed for moderate to severe pain 01/10/14   [name], NP  rosuvastatin (CRESTOR) 20 MG tablet Take 20 mg by mouth every morning.    Historical Provider, MD  triamcinolone cream (KENALOG) 0.1 %  Apply 1 application topically 2 (two) times daily. To eczema    Historical Provider, MD   BP 160/60 mmHg  Pulse 52  Temp(Src) 97.6 F (36.4 C) (Oral)  Resp 20  SpO2 97% Physical Exam  Constitutional: He is oriented to person, place, and time. He appears well-developed. No distress.  HENT:  Head: Normocephalic and atraumatic.  Eyes: Conjunctivae and EOM are normal.  Cardiovascular: Normal rate and regular rhythm.   Pulmonary/Chest: Effort normal. No stridor. No respiratory distress.  Abdominal: He exhibits no distension. There is no tenderness.  Musculoskeletal: He exhibits no edema.  Neurological: He is alert and oriented to person, place, and time. No cranial nerve deficit. He exhibits normal muscle tone. Coordination normal.  Skin: Skin is warm and dry.  Psychiatric: He has a normal mood and affect. His behavior is normal. Thought content normal.  Nursing note  and vitals reviewed.   ED Course  Procedures (including critical care time) Labs Review Labs Reviewed  COMPREHENSIVE METABOLIC PANEL  CBC WITH DIFFERENTIAL  URINALYSIS, ROUTINE W REFLEX MICROSCOPIC  I-STAT CG4 LACTIC ACID, ED   Chart review demonstrated the patient was recently evaluated for low back pain.  Currently patient has no back pain.  He also has no weakness in the lower extremities.  3:09 PM Patient in stable condition. Patient states that he was recently advised to stop taking his Rapiflo, but is unsure why.  MDM  Patient presents with ongoing dysuria. Patient is awake and alert, in no distress, hemodynamically stable, with soft, non-peritoneal abdomen, no evidence for neurologic compromise. Patient's labs are reassuring. Given the patient's history of enlarged prostate, and his recent cessation of Rapiflo, there is low suspicion for occult infectious etiology.  Patient had re-start of his Rapiflo and will f/u w Urology.    Gerhard Munchobert Rip Hawes, MD 03/09/14 [phone]

## 2014-03-09 NOTE — ED Notes (Signed)
Patient's daughter called and informed that the patient is discharged. Patient's daughter to pick patient up.

## 2014-03-09 NOTE — ED Notes (Signed)
Bed: WA17 Expected date: 03/09/14 Expected time: 12:52 PM Means of arrival: Ambulance Comments: Difficulty urinating

## 2015-03-26 ENCOUNTER — Emergency Department (HOSPITAL_COMMUNITY): Payer: Medicare Other

## 2015-03-26 ENCOUNTER — Inpatient Hospital Stay (HOSPITAL_COMMUNITY)
Admission: EM | Admit: 2015-03-26 | Discharge: 2015-03-31 | DRG: 378 | Disposition: A | Discharge status: Skilled Nursing Facility | Payer: Medicare Other | Attending: Internal Medicine | Admitting: Internal Medicine

## 2015-03-26 ENCOUNTER — Encounter (HOSPITAL_COMMUNITY): Payer: Self-pay | Admitting: Emergency Medicine

## 2015-03-26 DIAGNOSIS — Z9181 History of falling: Secondary | ICD-10-CM

## 2015-03-26 DIAGNOSIS — K5731 Diverticulosis of large intestine without perforation or abscess with bleeding: Secondary | ICD-10-CM | POA: Diagnosis present

## 2015-03-26 DIAGNOSIS — Z880 Allergy status to penicillin: Secondary | ICD-10-CM | POA: Diagnosis not present

## 2015-03-26 DIAGNOSIS — N400 Enlarged prostate without lower urinary tract symptoms: Secondary | ICD-10-CM | POA: Diagnosis present

## 2015-03-26 DIAGNOSIS — M25519 Pain in unspecified shoulder: Secondary | ICD-10-CM | POA: Diagnosis present

## 2015-03-26 DIAGNOSIS — Z955 Presence of coronary angioplasty implant and graft: Secondary | ICD-10-CM | POA: Diagnosis not present

## 2015-03-26 DIAGNOSIS — I2510 Atherosclerotic heart disease of native coronary artery without angina pectoris: Secondary | ICD-10-CM

## 2015-03-26 DIAGNOSIS — M545 Low back pain, unspecified: Secondary | ICD-10-CM | POA: Diagnosis present

## 2015-03-26 DIAGNOSIS — I10 Essential (primary) hypertension: Secondary | ICD-10-CM | POA: Diagnosis present

## 2015-03-26 DIAGNOSIS — E785 Hyperlipidemia, unspecified: Secondary | ICD-10-CM | POA: Diagnosis present

## 2015-03-26 DIAGNOSIS — W19XXXA Unspecified fall, initial encounter: Secondary | ICD-10-CM | POA: Diagnosis present

## 2015-03-26 DIAGNOSIS — K5791 Diverticulosis of intestine, part unspecified, without perforation or abscess with bleeding: Secondary | ICD-10-CM

## 2015-03-26 DIAGNOSIS — Z9861 Coronary angioplasty status: Secondary | ICD-10-CM

## 2015-03-26 DIAGNOSIS — D62 Acute posthemorrhagic anemia: Secondary | ICD-10-CM | POA: Diagnosis present

## 2015-03-26 DIAGNOSIS — Z79899 Other long term (current) drug therapy: Secondary | ICD-10-CM

## 2015-03-26 DIAGNOSIS — K2970 Gastritis, unspecified, without bleeding: Secondary | ICD-10-CM | POA: Diagnosis present

## 2015-03-26 DIAGNOSIS — G8929 Other chronic pain: Secondary | ICD-10-CM | POA: Diagnosis present

## 2015-03-26 DIAGNOSIS — K922 Gastrointestinal hemorrhage, unspecified: Secondary | ICD-10-CM | POA: Diagnosis present

## 2015-03-26 DIAGNOSIS — Z87891 Personal history of nicotine dependence: Secondary | ICD-10-CM

## 2015-03-26 LAB — CBC WITH DIFFERENTIAL/PLATELET
Basophils Absolute: 0 10*3/uL (ref 0.0–0.1)
Basophils Relative: 0 %
Eosinophils Absolute: 0.1 10*3/uL (ref 0.0–0.7)
Eosinophils Relative: 1 %
HCT: 30.8 % — ABNORMAL LOW (ref 39.0–52.0)
HEMOGLOBIN: 9.5 g/dL — AB (ref 13.0–17.0)
LYMPHS ABS: 1.3 10*3/uL (ref 0.7–4.0)
LYMPHS PCT: 15 %
MCH: 30.6 pg (ref 26.0–34.0)
MCHC: 30.8 g/dL (ref 30.0–36.0)
MCV: 99.4 fL (ref 78.0–100.0)
Monocytes Absolute: 0.9 10*3/uL (ref 0.1–1.0)
Monocytes Relative: 11 %
NEUTROS PCT: 72 %
Neutro Abs: 6.1 10*3/uL (ref 1.7–7.7)
Platelets: 179 10*3/uL (ref 150–400)
RBC: 3.1 MIL/uL — AB (ref 4.22–5.81)
RDW: 15.2 % (ref 11.5–15.5)
WBC: 8.4 10*3/uL (ref 4.0–10.5)

## 2015-03-26 LAB — BRAIN NATRIURETIC PEPTIDE: B Natriuretic Peptide: 301.1 pg/mL — ABNORMAL HIGH (ref 0.0–100.0)

## 2015-03-26 LAB — URINALYSIS, ROUTINE W REFLEX MICROSCOPIC
Bilirubin Urine: NEGATIVE
GLUCOSE, UA: NEGATIVE mg/dL
Hgb urine dipstick: NEGATIVE
KETONES UR: NEGATIVE mg/dL
Leukocytes, UA: NEGATIVE
NITRITE: NEGATIVE
Protein, ur: 30 mg/dL — AB
SPECIFIC GRAVITY, URINE: 1.02 (ref 1.005–1.030)
pH: 5.5 (ref 5.0–8.0)

## 2015-03-26 LAB — URINE MICROSCOPIC-ADD ON

## 2015-03-26 LAB — PREPARE RBC (CROSSMATCH)

## 2015-03-26 LAB — BASIC METABOLIC PANEL
Anion gap: 11 (ref 5–15)
BUN: 26 mg/dL — ABNORMAL HIGH (ref 6–20)
CHLORIDE: 105 mmol/L (ref 101–111)
CO2: 21 mmol/L — AB (ref 22–32)
Calcium: 9.3 mg/dL (ref 8.9–10.3)
Creatinine, Ser: 1.39 mg/dL — ABNORMAL HIGH (ref 0.61–1.24)
GFR calc Af Amer: 47 mL/min — ABNORMAL LOW (ref >60)
GFR calc non Af Amer: 41 mL/min — ABNORMAL LOW (ref >60)
GLUCOSE: 114 mg/dL — AB (ref 65–99)
POTASSIUM: 4.3 mmol/L (ref 3.5–5.1)
SODIUM: 137 mmol/L (ref 135–145)

## 2015-03-26 LAB — HEMOGLOBIN AND HEMATOCRIT, BLOOD
HCT: 29.2 % — ABNORMAL LOW (ref 39.0–52.0)
HCT: 27.9 % — ABNORMAL LOW (ref 39.0–52.0)
HEMATOCRIT: 28.3 % — AB (ref 39.0–52.0)
HEMOGLOBIN: 8.9 g/dL — AB (ref 13.0–17.0)
Hemoglobin: 8.9 g/dL — ABNORMAL LOW (ref 13.0–17.0)
Hemoglobin: 8.5 g/dL — ABNORMAL LOW (ref 13.0–17.0)

## 2015-03-26 LAB — I-STAT TROPONIN, ED: Troponin i, poc: 0.04 ng/mL (ref 0.00–0.08)

## 2015-03-26 LAB — PROTIME-INR
INR: 1.14 (ref 0.00–1.49)
PROTHROMBIN TIME: 14.8 s (ref 11.6–15.2)

## 2015-03-26 LAB — LACTIC ACID, PLASMA
LACTIC ACID, VENOUS: 1.2 mmol/L (ref 0.5–2.0)
LACTIC ACID, VENOUS: 1.6 mmol/L (ref 0.5–2.0)

## 2015-03-26 LAB — POC OCCULT BLOOD, ED: Fecal Occult Bld: POSITIVE — AB

## 2015-03-26 MED ORDER — LOPERAMIDE HCL 2 MG PO CAPS: 2.0000 mg | ORAL_CAPSULE | Freq: Four times a day (QID) | ORAL | Status: DC | PRN

## 2015-03-26 MED ORDER — ROSUVASTATIN CALCIUM 20 MG PO TABS
20.0000 mg | ORAL_TABLET | Freq: Every morning | ORAL | Status: DC
  Administered 2015-03-26 – 2015-03-31 (×6): 20 mg via ORAL
  Filled 2015-03-26 (×7): qty 1

## 2015-03-26 MED ORDER — SODIUM CHLORIDE 0.9 % IJ SOLN
3.0000 mL | Freq: Two times a day (BID) | INTRAMUSCULAR | Status: DC
  Administered 2015-03-26 – 2015-03-31 (×4): 3 mL via INTRAVENOUS

## 2015-03-26 MED ORDER — TRIAMCINOLONE ACETONIDE 0.1 % EX CREA
1.0000 | TOPICAL_CREAM | Freq: Two times a day (BID) | CUTANEOUS | Status: DC
Start: 2015-03-26 — End: 2015-03-31
  Administered 2015-03-26 – 2015-03-31 (×9): 1 via TOPICAL
  Filled 2015-03-26: qty 15

## 2015-03-26 MED ORDER — TAMSULOSIN HCL 0.4 MG PO CAPS
0.4000 mg | ORAL_CAPSULE | Freq: Every day | ORAL | Status: DC
  Administered 2015-03-26 – 2015-03-31 (×6): 0.4 mg via ORAL
  Filled 2015-03-26 (×6): qty 1

## 2015-03-26 MED ORDER — ONDANSETRON HCL 4 MG PO TABS
4.0000 mg | ORAL_TABLET | Freq: Four times a day (QID) | ORAL | Status: DC | PRN
Start: 2015-03-26 — End: 2015-03-31

## 2015-03-26 MED ORDER — SODIUM CHLORIDE 0.9 % IV SOLN
Freq: Once | INTRAVENOUS | Status: AC
  Administered 2015-03-26: 11:00:00 via INTRAVENOUS

## 2015-03-26 MED ORDER — FLUTICASONE PROPIONATE 50 MCG/ACT NA SUSP
2.0000 | Freq: Every day | NASAL | Status: DC
  Administered 2015-03-26 – 2015-03-31 (×5): 2 via NASAL
  Filled 2015-03-26: qty 16

## 2015-03-26 MED ORDER — DICLOFENAC SODIUM 1 % TD GEL
4.0000 g | Freq: Three times a day (TID) | TRANSDERMAL | Status: DC
  Administered 2015-03-26: 4 g via TOPICAL
  Filled 2015-03-26: qty 100

## 2015-03-26 MED ORDER — ONDANSETRON HCL 4 MG/2ML IJ SOLN: 4.0000 mg | Freq: Four times a day (QID) | INTRAMUSCULAR | Status: DC | PRN

## 2015-03-26 MED ORDER — AMLODIPINE BESYLATE 5 MG PO TABS: 5.0000 mg | ORAL_TABLET | Freq: Every day | ORAL | Status: DC

## 2015-03-26 MED ORDER — ACETAMINOPHEN 325 MG PO TABS
650.0000 mg | ORAL_TABLET | Freq: Four times a day (QID) | ORAL | Status: DC | PRN
Start: 2015-03-26 — End: 2015-03-31
  Administered 2015-03-27 – 2015-03-31 (×7): 650 mg via ORAL
  Filled 2015-03-26 (×8): qty 2

## 2015-03-26 MED ORDER — GUAIFENESIN ER 600 MG PO TB12
600.0000 mg | ORAL_TABLET | Freq: Two times a day (BID) | ORAL | Status: DC
  Administered 2015-03-26 – 2015-03-31 (×11): 600 mg via ORAL
  Filled 2015-03-26 (×13): qty 1

## 2015-03-26 MED ORDER — FINASTERIDE 5 MG PO TABS
5.0000 mg | ORAL_TABLET | Freq: Every day | ORAL | Status: DC
  Administered 2015-03-26 – 2015-03-31 (×6): 5 mg via ORAL
  Filled 2015-03-26 (×6): qty 1

## 2015-03-26 MED ORDER — PANTOPRAZOLE SODIUM 40 MG IV SOLR
40.0000 mg | Freq: Two times a day (BID) | INTRAVENOUS | Status: DC
  Administered 2015-03-26 – 2015-03-31 (×11): 40 mg via INTRAVENOUS
  Filled 2015-03-26 (×11): qty 40

## 2015-03-26 MED ORDER — SODIUM CHLORIDE 0.9 % IV SOLN
INTRAVENOUS | Status: DC
  Administered 2015-03-26 – 2015-03-30 (×4): via INTRAVENOUS

## 2015-03-26 MED ORDER — METOPROLOL SUCCINATE ER 25 MG PO TB24
25.0000 mg | ORAL_TABLET | Freq: Every day | ORAL | Status: DC
  Administered 2015-03-26 – 2015-03-31 (×6): 25 mg via ORAL
  Filled 2015-03-26 (×6): qty 1

## 2015-03-26 MED ORDER — CITALOPRAM HYDROBROMIDE 20 MG PO TABS
20.0000 mg | ORAL_TABLET | Freq: Every day | ORAL | Status: DC
  Administered 2015-03-26 – 2015-03-31 (×6): 20 mg via ORAL
  Filled 2015-03-26 (×4): qty 1
  Filled 2015-03-26: qty 2
  Filled 2015-03-26: qty 1

## 2015-03-26 NOTE — ED Provider Notes (Signed)
CSN: [id number]     Arrival date & time 03/26/15  0132 History  By signing my name below, I, Luke Sanchez, attest that this documentation has been prepared under the direction and in the presence of [name], MD. Electronically Signed: [name], ED Scribe. 03/26/2015. 3:01 AM.    Chief Complaint  Patient presents with  . Shoulder Pain  . Rectal Bleeding      The history is provided by the patient and the EMS personnel. No language interpreter was used.   HPI Comments: Luke Sanchez is a 81 y.o. male brought in by ambulance, who has a hx of diverticulosis, HTN, CAD and arthritis presents to the Emergency Department complaining of acute onset, moderate, intermittent, gradual worsening rectal bleeding onset this evening. Pt states he has had 3 episodes of rectal bleeding tonight. He reports having rectal bleeding in the past. Pt denies abdominal pain, nausea, vomiting, and diarrhea.  Past Medical History  Diagnosis Date  . Hypertension   . Bronchitis   . Arthritis   . Pneumonia     hx of  . CAD (coronary artery disease)     stent RCA 2010-[name]  . UTI (lower urinary tract infection)    Past Surgical History  Procedure Laterality Date  . Coronary stent placement    . Hernia repair      x 2   History reviewed. No pertinent family history. Social History  Substance Use Topics  . Smoking status: Never Smoker   . Smokeless tobacco: Former NeurosurgeonUser    Types: Snuff, Chew  . Alcohol Use: No    Review of Systems  A complete 10 system review of systems was obtained and all systems are negative except as noted in the HPI and PMH.    Allergies  Penicillins  Home Medications   Prior to Admission medications   Medication Sig Start Date End Date Taking? Authorizing Provider  acetaminophen (TYLENOL) 325 MG tablet Take 2 tablets (650 mg total) by mouth every 6 (six) hours as needed for mild pain (or Fever >/= 101). 01/07/14   [name], MD  amLODipine (NORVASC) 5 MG  tablet Take 2 tablets (10 mg total) by mouth daily. Patient taking differently: Take 5 mg by mouth daily.  06/19/12   [name], MD  ARTIFICIAL TEAR OP Apply 1 drop to eye daily as needed. For dry eyes    Historical Provider, MD  diclofenac sodium (VOLTAREN) 1 % GEL Apply 4 g topically 3 (three) times daily. knee    Historical Provider, MD  finasteride (PROSCAR) 5 MG tablet Take 5 mg by mouth daily.     Historical Provider, MD  fluticasone (FLONASE) 50 MCG/ACT nasal spray Place 2 sprays into the nose daily. 02/11/13   [name], MD  loperamide (IMODIUM A-D) 2 MG tablet Take 1 tablet (2 mg total) by mouth 4 (four) times daily as needed for diarrhea or loose stools. 06/19/12   [name], MD  metoprolol (TOPROL-XL) 50 MG 24 hr tablet Take 50 mg by mouth daily.     Historical Provider, MD  oxyCODONE (OXY IR/ROXICODONE) 5 MG immediate release tablet Take one tablet by mouth every 4 hours as needed for mild to moderate pain; Take two tablets by mouth every 4 hours as needed for moderate to severe pain 01/10/14   [name], NP  rosuvastatin (CRESTOR) 20 MG tablet Take 20 mg by mouth every morning.    Historical Provider, MD  silodosin (RAPAFLO) 8  MG CAPS capsule Take 1 capsule (8 mg total) by mouth daily with breakfast. 03/09/14   [name], MD  triamcinolone cream (KENALOG) 0.1 % Apply 1 application topically 2 (two) times daily. To eczema    Historical Provider, MD   Triage vitals: BP 119/57 mmHg  Pulse 76  Temp(Src) 97.8 F (36.6 C) (Oral)  Ht 6' (1.829 m)  Wt 152 lb (68.947 kg)  BMI 20.61 kg/m2  SpO2 97% Physical Exam  Constitutional: He is oriented to person, place, and time. Vital signs are normal. He appears well-developed and well-nourished.  Non-toxic appearance. He does not appear ill. No distress.  HENT:  Head: Normocephalic and atraumatic.  Nose: Nose normal.  Mouth/Throat: Oropharynx is clear and moist. No oropharyngeal exudate.  Eyes: Conjunctivae and  EOM are normal. Pupils are equal, round, and reactive to light. No scleral icterus.  Neck: Normal range of motion. Neck supple. No tracheal deviation, no edema, no erythema and normal range of motion present. No thyroid mass and no thyromegaly present.  Cardiovascular: Normal rate, regular rhythm, S1 normal, S2 normal, normal heart sounds, intact distal pulses and normal pulses.  Exam reveals no gallop and no friction rub.   No murmur heard. Pulmonary/Chest: Effort normal and breath sounds normal. No respiratory distress. He has no wheezes. He has no rhonchi. He has no rales.  Abdominal: Soft. Normal appearance and bowel sounds are normal. He exhibits no distension, no ascites and no mass. There is no hepatosplenomegaly. There is no tenderness. There is no rebound, no guarding and no CVA tenderness.  Genitourinary: Guaiac positive stool.  Musculoskeletal: Normal range of motion. He exhibits no edema or tenderness.  Lymphadenopathy:    He has no cervical adenopathy.  Neurological: He is alert and oriented to person, place, and time. He has normal strength. He displays abnormal reflex. No cranial nerve deficit or sensory deficit.  Skin: Skin is warm, dry and intact. No petechiae and no rash noted. He is not diaphoretic. No erythema. No pallor.  Psychiatric: He has a normal mood and affect. His behavior is normal. Judgment normal.  Nursing note and vitals reviewed.   ED Course  Procedures  DIAGNOSTIC STUDIES: Oxygen Saturation is 97% on RA, normal by my interpretation.  COORDINATION OF CARE:  2:31 AM Discussed treatment plan with pt at bedside and pt agreed to plan.  Labs Review Labs Reviewed  CBC WITH DIFFERENTIAL/PLATELET - Abnormal; Notable for the following:    RBC 3.10 (*)    Hemoglobin 9.5 (*)    HCT 30.8 (*)    All other components within normal limits  BASIC METABOLIC PANEL - Abnormal; Notable for the following:    CO2 21 (*)    Glucose, Bld 114 (*)    BUN 26 (*)     Creatinine, Ser 1.39 (*)    GFR calc non Af Amer 41 (*)    GFR calc Af Amer 47 (*)    All other components within normal limits  URINALYSIS, ROUTINE W REFLEX MICROSCOPIC (NOT AT [facility]) - Abnormal; Notable for the following:    Protein, ur 30 (*)    All other components within normal limits  URINE MICROSCOPIC-ADD ON - Abnormal; Notable for the following:    Squamous Epithelial / LPF 0-5 (*)    Bacteria, UA RARE (*)    Casts HYALINE CASTS (*)    All other components within normal limits  POC OCCULT BLOOD, ED - Abnormal; Notable for the following:    Fecal Occult Bld  POSITIVE (*)    All other components within normal limits  URINE CULTURE  PROTIME-INR  HEMOGLOBIN AND HEMATOCRIT, BLOOD  HEMOGLOBIN AND HEMATOCRIT, BLOOD  HEMOGLOBIN AND HEMATOCRIT, BLOOD  LACTIC ACID, PLASMA  LACTIC ACID, PLASMA  BRAIN NATRIURETIC PEPTIDE  I-STAT TROPOININ, ED  TYPE AND SCREEN    Imaging Review Dg Chest 2 View  03/26/2015  CLINICAL DATA:  81 year old male with fall and left shoulder pain EXAM: CHEST  2 VIEW COMPARISON:  Chest radiograph dated 01/03/2014 FINDINGS: Two views of the chest do not demonstrate any focal consolidation, pleural effusion, or pneumothorax. Stable cardiomegaly. Degenerative changes of the spine. No acute fracture. IMPRESSION: No active cardiopulmonary disease. Electronically Signed   By: Elgie Collard M.D.   On: 03/26/2015 02:58   Dg Shoulder Left  03/26/2015  CLINICAL DATA:  Status post fall, with left shoulder pain. Initial encounter. EXAM: LEFT SHOULDER - 2+ VIEW COMPARISON:  Left shoulder radiographs performed 09/02/2010 FINDINGS: There is no evidence of fracture or dislocation. The left humeral head is seated within the glenoid fossa. The left acromioclavicular joint demonstrates mild degenerative change, with a small overlying osseous fragment. No significant soft tissue abnormalities are seen. The visualized portions of the left lung are clear. IMPRESSION: No evidence of  fracture or dislocation. Chronic degenerative change noted at the left acromioclavicular joint. Electronically Signed   By: Roanna Raider M.D.   On: 03/26/2015 02:57   I have personally reviewed and evaluated these images and lab results as part of my medical decision-making.   EKG Interpretation None      MDM   Final diagnoses:  None   Patient presents to the emergency department for GI bleed. He states he's had bright red blood in his rectum for 24 hours. Physical exam supports this diagnosis.  Patient said last time it was related to diverticular bleed although the patient is not having any abdominal pain currently. I do not believe CT scan is warranted currently. Patient is admitted to [facility] for further care.type and Screen was sent. I spoke with Dr. Katrinka Blazing who will admit the patient to MedSurg.    I personally performed the services described in this documentation, which was scribed in my presence. The recorded information has been reviewed and is accurate.     Tomasita Crumble, MD 03/26/15 [phone]

## 2015-03-26 NOTE — Care Management Note (Signed)
Case Management Note  Patient Details  Name: Luke Sanchez MRN: [id number] Date of Birth: [birth year]  Subjective/Objective:                    Action/Plan:  Initial UR completed . Expected Discharge Date:  03/28/15               Expected Discharge Plan:  Skilled Nursing Facility  In-House Referral:  Clinical Social Work  Discharge planning Services     Post Acute Care Choice:    Choice offered to:     DME Arranged:    DME Agency:     HH Arranged:    HH Agency:     Status of Service:  In process, will continue to follow  Medicare Important Message Given:    Date Medicare IM Given:    Medicare IM give by:    Date Additional Medicare IM Given:    Additional Medicare Important Message give by:     If discussed at Long Length of Stay Meetings, dates discussed:    Additional Comments:  Luke Sanchez, Luke Sayres Marie, RN 03/26/2015, 1:38 PM

## 2015-03-26 NOTE — Progress Notes (Addendum)
[facility] PROGRESS NOTE  Leanora Coverrthur Kaminski WUX:[id number] DOB: 03/21/1915 DOA: 03/26/2015 PCP: Evlyn CourierHILL,GERALD K, MD  Assessment/Plan: GI bleed: Acute. Continue with IV Protonix.  Cycle hb. Hb trending down slowly. Had another BM with blood in the ED. Will transfuse one unit PRBC.  Discussed with Dr Ewing SchleinMagod, continue with clear diet, cycle hb. If patient develops severe GI bleed, needs nuclear bleeding scan and IR evaluation. Dr Ewing SchleinMagod will be available as needed during this week.   Suspect Acute blood loss anemia: As seen above -Continue to monitor & transfuse if needed  CAD S/P percutaneous coronary angioplasty -Held aspirin   HTN (hypertension) -Continue with metoprolol per home dose. Hold Norvasc.   HLD (hyperlipidemia) -Continue Crestor   Low back pain: chronic issue  -May need further assessment for possible fractures in regards to recent fall once acute issue resolved. -hold diclofenac.   Fall: Recent fall. Patient appears to have just missed his seat. -Physical therapy to eval and treat  BPH: stable -Continue Proscar -Substitution for silodosin with Flomax   Code Status: Full code.  Family Communication: care discussed with patient.  Disposition Plan: Remain inpatient for treatment of GI bleed.    Consultants:  GI  Procedures:  none  Antibiotics:  none  HPI/Subjective: Alert in no distress.  Denies abdominal pain. Had bloody BM this am in the ED. Per nurse 2 spoon full of bright blood.  He report some cough.   Objective: Filed Vitals:   03/26/15 0615 03/26/15 0630  BP:  141/80  Pulse: 83   Temp:    Resp: 22    No intake or output data in the 24 hours ending 03/26/15 1009 Filed Weights   03/26/15 0141  Weight: 68.947 kg (152 lb)    Exam:   General:  NAD  Cardiovascular: S 1, S 2, RRR  Respiratory: CTA  Abdomen: BS present, soft, nt.  Musculoskeletal: no edema   Data Reviewed: Basic Metabolic Panel:  Recent Labs Lab  03/26/15 0202  NA 137  K 4.3  CL 105  CO2 21*  GLUCOSE 114*  BUN 26*  CREATININE 1.39*  CALCIUM 9.3   Liver Function Tests: No results for input(s): AST, ALT, ALKPHOS, BILITOT, PROT, ALBUMIN in the last 168 hours. No results for input(s): LIPASE, AMYLASE in the last 168 hours. No results for input(s): AMMONIA in the last 168 hours. CBC:  Recent Labs Lab 03/26/15 0202 03/26/15 0644  WBC 8.4  --   NEUTROABS 6.1  --   HGB 9.5* 8.9*  HCT 30.8* 29.2*  MCV 99.4  --   PLT 179  --    Cardiac Enzymes: No results for input(s): CKTOTAL, CKMB, CKMBINDEX, TROPONINI in the last 168 hours. BNP (last 3 results)  Recent Labs  03/26/15 0644  BNP 301.1*    ProBNP (last 3 results) No results for input(s): PROBNP in the last 8760 hours.  CBG: No results for input(s): GLUCAP in the last 168 hours.  No results found for this or any previous visit (from the past 240 hour(s)).   Studies: Dg Chest 2 View  03/26/2015  CLINICAL DATA:  81 year old male with fall and left shoulder pain EXAM: CHEST  2 VIEW COMPARISON:  Chest radiograph dated 01/03/2014 FINDINGS: Two views of the chest do not demonstrate any focal consolidation, pleural effusion, or pneumothorax. Stable cardiomegaly. Degenerative changes of the spine. No acute fracture. IMPRESSION: No active cardiopulmonary disease. Electronically Signed   By: Elgie CollardArash  Radparvar M.D.   On: 03/26/2015 02:58  Dg Shoulder Left  03/26/2015  CLINICAL DATA:  Status post fall, with left shoulder pain. Initial encounter. EXAM: LEFT SHOULDER - 2+ VIEW COMPARISON:  Left shoulder radiographs performed 09/02/2010 FINDINGS: There is no evidence of fracture or dislocation. The left humeral head is seated within the glenoid fossa. The left acromioclavicular joint demonstrates mild degenerative change, with a small overlying osseous fragment. No significant soft tissue abnormalities are seen. The visualized portions of the left lung are clear. IMPRESSION: No  evidence of fracture or dislocation. Chronic degenerative change noted at the left acromioclavicular joint. Electronically Signed   By: Roanna Raider M.D.   On: 03/26/2015 02:57    Scheduled Meds: . citalopram  20 mg Oral Daily  . diclofenac sodium  4 g Topical TID  . finasteride  5 mg Oral Daily  . fluticasone  2 spray Each Nare Daily  . metoprolol succinate  25 mg Oral Daily  . pantoprazole (PROTONIX) IV  40 mg Intravenous Q12H  . rosuvastatin  20 mg Oral q morning - 10a  . sodium chloride  3 mL Intravenous Q12H  . tamsulosin  0.4 mg Oral Daily  . triamcinolone cream  1 application Topical BID   Continuous Infusions: . sodium chloride 75 mL/hr at 03/26/15 1610    Principal Problem:   GI bleed Active Problems:   CAD S/P percutaneous coronary angioplasty   Acute blood loss anemia   HTN (hypertension)   HLD (hyperlipidemia)   Low back pain   Fall   BPH (benign prostatic hyperplasia)    Time spent: 35 minutes.     Hartley Barefoot A  [facility] Pager [phone] If 7PM-7AM, please contact night-coverage at [email or web address], password [email or web address] 03/26/2015, 10:09 AM  LOS: 0 days

## 2015-03-26 NOTE — ED Notes (Signed)
Attempted report 

## 2015-03-26 NOTE — Evaluation (Signed)
Physical Therapy Evaluation Patient Details Name: Luke Sanchez MRN: [id number] DOB: 09/12/1914 Today's Date: 03/26/2015   History of Present Illness  pt presents after a fall missing his chair while sitting and c/o GIB.    Clinical Impression  Pt generally weak and indicates soreness in his back and R knee.  Pt states his daughter lives with him, but unclear level of A she is able to provide.  At this time pt requires A for bed mobility and transfers, and would need 24hr support.  Feel pt would benefit from SNF level of care at D/C.  Will continue to follow and can update D/C plan if pt's daughter is able to provide needed A and wishes pt to return to home.      Follow Up Recommendations SNF    Equipment Recommendations  None recommended by PT    Recommendations for Other Services       Precautions / Restrictions Precautions Precautions: Fall Restrictions Weight Bearing Restrictions: No      Mobility  Bed Mobility Overal bed mobility: Needs Assistance;+2 for physical assistance Bed Mobility: Supine to Sit;Sit to Supine     Supine to sit: Mod assist;+2 for physical assistance Sit to supine: Mod assist;+2 for physical assistance   General bed mobility comments: A with trunk and Bil LEs.    Transfers Overall transfer level: Needs assistance Equipment used: 2 person hand held assist Transfers: Sit to/from Stand Sit to Stand: Min assist;+2 physical assistance         General transfer comment: Blocked Bil feet as pt tends to push LEs against bed to A with support when coming to standing.  pt with posterior lean.    Ambulation/Gait Ambulation/Gait assistance: Min assist;+2 physical assistance   Assistive device: 2 person hand held assist       General Gait Details: pt able to take 3 very small side steps towards HOB.    Stairs            Wheelchair Mobility    Modified Rankin (Stroke Patients Only)       Balance Overall balance assessment: Needs  assistance Sitting-balance support: Single extremity supported;Feet supported Sitting balance-Leahy Scale: Fair     Standing balance support: Bilateral upper extremity supported;During functional activity Standing balance-Leahy Scale: Poor                               Pertinent Vitals/Pain Pain Assessment: Faces Faces Pain Scale: Hurts even more Pain Location: Low Back and R knee.   Pain Descriptors / Indicators: Grimacing;Sore Pain Intervention(s): Monitored during session;Repositioned (RN aware of pain.)    Home Living Family/patient expects to be discharged to:: Private residence Living Arrangements: Children (Daughter) Available Help at Discharge: Family;Available PRN/intermittently (Unclear if able to provide 24hr care.) Type of Home: House Home Access: Stairs to enter   [employer] of Steps: "a few" Home Layout: One level Home Equipment: Walker - 2 wheels;Cane - single point      Prior Function Level of Independence: Needs assistance   Gait / Transfers Assistance Needed: Uses RW  ADL's / Homemaking Assistance Needed: Daughter A with lower body dressing when pt isn't feeling well.  Daughter performs homemaking tasks.        Hand Dominance        Extremity/Trunk Assessment   Upper Extremity Assessment: Generalized weakness           Lower Extremity Assessment: Generalized weakness  Cervical / Trunk Assessment: Kyphotic  Communication   Communication: No difficulties  Cognition Arousal/Alertness: Awake/alert Behavior During Therapy: WFL for tasks assessed/performed Overall Cognitive Status: Within Functional Limits for tasks assessed                      General Comments      Exercises        Assessment/Plan    PT Assessment Patient needs continued PT services  PT Diagnosis Difficulty walking;Generalized weakness   PT Problem List Decreased strength;Decreased activity tolerance;Decreased  balance;Decreased mobility;Decreased coordination;Decreased knowledge of use of DME;Pain  PT Treatment Interventions DME instruction;Gait training;Stair training;Functional mobility training;Therapeutic activities;Therapeutic exercise;Balance training;Patient/family education   PT Goals (Current goals can be found in the Care Plan section) Acute Rehab PT Goals Patient Stated Goal: Get my strength back PT Goal Formulation: With patient Time For Goal Achievement: 04/09/15 Potential to Achieve Goals: Fair    Frequency Min 3X/week   Barriers to discharge        Co-evaluation               End of Session Equipment Utilized During Treatment: Gait belt Activity Tolerance: Patient limited by fatigue;Patient limited by pain Patient left: in bed;with call bell/phone within reach Nurse Communication: Mobility status         Time: [id number] PT Time Calculation (min) (ACUTE ONLY): 22 min   Charges:   PT Evaluation $Initial PT Evaluation Tier I: 1 Procedure     PT G CodesSunny Schlein:        Jimesha Rising F, [place]PT [phone] 03/26/2015, 10:17 AM

## 2015-03-26 NOTE — ED Notes (Addendum)
Patient to ED via [facility] following a fall while at home. Patient states he was attempting to sit on his chair and missed it, falling on his L shoulder - tender to touch, pt denies pain except for aching d/t "arthritis all over," no obvious deformity noted. Pt denies LOC, hitting head, or other injuries. While [facility] was getting patient ready for transfer, pt's daughter mentioned that he had a bloody stool yesterday - pt does have a hx of colitis, but last time was 5 years ago. Pt denies abdominal pain, no N/V/D, patient states he knows about this and usually goes away.  EMS VS: 160/92, HR 72, NSR, 94% RA, CBG 140. Patient A&O x 4, NAD at this time.

## 2015-03-26 NOTE — H&P (Signed)
[facility] History and Physical  Luke Sanchez ZOX:[id number] DOB: February 09, 1915 DOA: 03/26/2015  Referring physician: ED PCP: Evlyn Courier, MD   Chief Complaint: "Slipped off of my lawn chair and I have been bleeding in my stools"  HPI:  This is a 81 year old male with a past medical history significant for coronary artery disease, arthritis, diverticulosis, previous GI bleed; who presents with complaints of a fall this afternoon and blood per rectum. He notes that while trying to sit down in his lawn chair and basically missed the chair and hit his left arm. He was unable to get up and therefore EMS was called by his daughter. He denied any dizziness, loss of consciousness, or trauma to his head. Patient notes that for the last day or so that he's had at least 3 bowel movements that were bright red. Patient notes a previous history of diverticular bleed back in 2011. Review of records shows that his last colonoscopy appears to been in 06/2009 when patient was seen to have multiple diverticula in the colon the highest concentration seen in the descending and sigmoid at that time after a previous bleed. Patient denies any chest pain, shortness of breath, or palpitations. Otherwise patient notes associated symptoms with a history of arthritis where he aches all over. Patient notes that he takes Tylenol as needed and avoids NSAIDs other than taking a daily aspirin.  Upon admission into the emergency department it was noted that the patient's hemoglobin had dropped from 10.3 in 03/09/14  to 9.5 on admission. ED physician performed a rectal evaluation which showed gross bright red blood per rectum. Will admit for further evaluation and monitoring.  Review of Systems  Constitutional: Negative for fever and chills.  HENT: Positive for hearing loss. Negative for ear pain and tinnitus.   Eyes: Negative for pain and discharge.  Respiratory: Negative for cough and hemoptysis.   Cardiovascular: Negative  for chest pain and palpitations.  Gastrointestinal: Positive for blood in stool. Negative for nausea.  Genitourinary: Negative for dysuria, hematuria and flank pain.  Musculoskeletal: Positive for back pain, joint pain and falls.  Skin: Negative for itching and rash.  Neurological: Negative for sensory change and speech change.  Endo/Heme/Allergies: Negative for polydipsia. Bruises/bleeds easily.  Psychiatric/Behavioral: Negative for suicidal ideas and substance abuse.     Past Medical History  Diagnosis Date  . Hypertension   . Bronchitis   . Arthritis   . Pneumonia     hx of  . CAD (coronary artery disease)     stent RCA 2010-[name]  . UTI (lower urinary tract infection)      Past Surgical History  Procedure Laterality Date  . Coronary stent placement    . Hernia repair      x 2      Social History:  reports that he has never smoked. He has quit using smokeless tobacco. His smokeless tobacco use included Snuff and Chew. He reports that he does not drink alcohol or use illicit drugs. Where does patient live--home   Can patient participate in ADLs? Yes  Allergies  Allergen Reactions  . Penicillins Anaphylaxis    History reviewed. No pertinent family history.      Prior to Admission medications   Medication Sig Start Date End Date Taking? Authorizing Provider  acetaminophen (TYLENOL) 325 MG tablet Take 2 tablets (650 mg total) by mouth every 6 (six) hours as needed for mild pain (or Fever >/= 101). 01/07/14   Alison Murray, MD  amLODipine (NORVASC)  5 MG tablet Take 2 tablets (10 mg total) by mouth daily. Patient taking differently: Take 5 mg by mouth daily.  06/19/12   Ripudeep Jenna Luo, MD  ARTIFICIAL TEAR OP Apply 1 drop to eye daily as needed. For dry eyes    Historical Provider, MD  diclofenac sodium (VOLTAREN) 1 % GEL Apply 4 g topically 3 (three) times daily. knee    Historical Provider, MD  finasteride (PROSCAR) 5 MG tablet Take 5 mg by mouth daily.      Historical Provider, MD  fluticasone (FLONASE) 50 MCG/ACT nasal spray Place 2 sprays into the nose daily. 02/11/13   Garnetta Buddy, MD  loperamide (IMODIUM A-D) 2 MG tablet Take 1 tablet (2 mg total) by mouth 4 (four) times daily as needed for diarrhea or loose stools. 06/19/12   Ripudeep Jenna Luo, MD  metoprolol (TOPROL-XL) 50 MG 24 hr tablet Take 50 mg by mouth daily.     Historical Provider, MD  oxyCODONE (OXY IR/ROXICODONE) 5 MG immediate release tablet Take one tablet by mouth every 4 hours as needed for mild to moderate pain; Take two tablets by mouth every 4 hours as needed for moderate to severe pain 01/10/14   Sharon Seller, NP  rosuvastatin (CRESTOR) 20 MG tablet Take 20 mg by mouth every morning.    Historical Provider, MD  silodosin (RAPAFLO) 8 MG CAPS capsule Take 1 capsule (8 mg total) by mouth daily with breakfast. 03/09/14   [name], MD  triamcinolone cream (KENALOG) 0.1 % Apply 1 application topically 2 (two) times daily. To eczema    Historical Provider, MD     Physical Exam: Filed Vitals:   03/26/15 0133 03/26/15 0141 03/26/15 0145  BP:  117/75 119/57  Pulse:  76   Temp:  97.8 F (36.6 C)   TempSrc:  Oral   Height:  6' (1.829 m)   Weight:  68.947 kg (152 lb)   SpO2: 94% 97%      Constitutional: Vital signs reviewed. Patient is a elderly male who appears to be otherwise in no acute distress laying in hospital bed. Alert and oriented 3 and able to cooperate with exam. Head: Normocephalic and atraumatic  Ear: TM normal bilaterally  Mouth: no erythema or exudates, MMM  Eyes: PERRL, EOMI, conjunctivae normal, No scleral icterus.  Neck: Supple, Trachea midline normal ROM, No JVD, mass, thyromegaly, or carotid bruit present.  Cardiovascular: RRR, S1 normal, S2 normal, positive systolic ejection murmur, pulses symmetric and intact bilaterally  Pulmonary/Chest: CTAB, no wheezes, rales, or rhonchi  Abdominal: Soft. Non-tender, non-distended, bowel sounds are  normal, no masses, organomegaly, or guarding present.  GU: no CVA tenderness Musculoskeletal: No joint deformities, erythema, or stiffness, ROM full and no nontender Ext: +2 pitting edema worse on the left lower extremity versus the right(patient states that this is chronic). No cyanosis, pulses palpable bilaterally (DP and PT)  Hematology: no cervical, inginal, or axillary adenopathy.  Neurological: A&O x3, Strenght is normal and symmetric bilaterally, cranial nerve II-XII are grossly intact, no focal motor deficit, sensory intact to light touch bilaterally.  Skin: Warm, dry and intact. No rash, cyanosis, or clubbing.  Psychiatric: Normal mood and affect. speech and behavior is normal. Judgment and thought content normal. Cognition and memory are normal.      Data Review   Micro Results No results found for this or any previous visit (from the past 240 hour(s)).  Radiology Reports No results found.   CBC  Recent Labs Lab  03/26/15 0202  WBC 8.4  HGB 9.5*  HCT 30.8*  PLT 179  MCV 99.4  MCH 30.6  MCHC 30.8  RDW 15.2  LYMPHSABS 1.3  MONOABS 0.9  EOSABS 0.1  BASOSABS 0.0    Chemistries   Recent Labs Lab 03/26/15 0202  NA 137  K 4.3  CL 105  CO2 21*  GLUCOSE 114*  BUN 26*  CREATININE 1.39*  CALCIUM 9.3   ------------------------------------------------------------------------------------------------------------------ estimated creatinine clearance is 28.9 mL/min (by C-G formula based on Cr of 1.39). ------------------------------------------------------------------------------------------------------------------ No results for input(s): HGBA1C in the last 72 hours. ------------------------------------------------------------------------------------------------------------------ No results for input(s): CHOL, HDL, LDLCALC, TRIG, CHOLHDL, LDLDIRECT in the last 72  hours. ------------------------------------------------------------------------------------------------------------------ No results for input(s): TSH, T4TOTAL, T3FREE, THYROIDAB in the last 72 hours.  Invalid input(s): FREET3 ------------------------------------------------------------------------------------------------------------------ No results for input(s): VITAMINB12, FOLATE, FERRITIN, TIBC, IRON, RETICCTPCT in the last 72 hours.  Coagulation profile  Recent Labs Lab 03/26/15 0202  INR 1.14    No results for input(s): DDIMER in the last 72 hours.  Cardiac Enzymes No results for input(s): CKMB, TROPONINI, MYOGLOBIN in the last 168 hours.  Invalid input(s): CK ------------------------------------------------------------------------------------------------------------------ Invalid input(s): POCBNP   CBG: No results for input(s): GLUCAP in the last 168 hours.     EKG: Independently reviewed. Sinus rhythm with LVH Assessment/Plan Principal Problem:   GI bleed: Acute. Patient reports a least 3-4 bowel movements with bright red blood. + 4 blood per rectum on admission. Previous history of diverticular bleeds in the past. Patient initial hemoglobin was noted to be 9.5 g/dl. Patient otherwise hemodynamically stable and in no acute distress. -Admit to telemetry bed -Will need to consult GI in a.m. -H&H every 4 hours -Protonix 40 mg IV twice a day -IV fluids normal saline at 3875ml/hr as patient hemodynamically stable and previous heart function unknown -Patient was typed and cross for packed red blood cells    Suspect Acute blood loss anemia: As seen above -Continue to monitor & transfuse if needed    CAD S/P percutaneous coronary angioplasty -Held aspirin    HTN (hypertension)  -Continue amlodipine and metoprolol per home dose  HLD (hyperlipidemia) -Continue Crestor    Low back pain: chronic issue  -May need further assessment for possible fractures in regards to  recent fall once acute issue resolved.    Fall: Recent fall.  Patient appears to have just missed his seat. -Physical therapy to eval and treat  BPH: stable -Continue Proscar -Substitution for silodosin with Flomax   Social work consult placed  Code Status:   full Family Communication: bedside Disposition Plan: admit   Total time spent 55 minutes.Greater than 50% of this time was spent in counseling, explanation of diagnosis, planning of further management, and coordination of care  Clydie BraunRondell A Smith [facility] Pager [phone]  If 7PM-7AM, please contact night-coverage [email or web address] Password [email or web address] 03/26/2015, 2:49 AM

## 2015-03-26 NOTE — ED Notes (Signed)
Ordered diet tray 

## 2015-03-27 ENCOUNTER — Inpatient Hospital Stay (HOSPITAL_COMMUNITY): Payer: Medicare Other

## 2015-03-27 LAB — URINE CULTURE

## 2015-03-27 LAB — CBC
HCT: 30.6 % — ABNORMAL LOW (ref 39.0–52.0)
HEMATOCRIT: 32.9 % — AB (ref 39.0–52.0)
HEMOGLOBIN: 10 g/dL — AB (ref 13.0–17.0)
HEMOGLOBIN: 9.7 g/dL — AB (ref 13.0–17.0)
MCH: 29.4 pg (ref 26.0–34.0)
MCH: 30.4 pg (ref 26.0–34.0)
MCHC: 30.4 g/dL (ref 30.0–36.0)
MCHC: 31.7 g/dL (ref 30.0–36.0)
MCV: 96.8 fL (ref 78.0–100.0)
MCV: 95.9 fL (ref 78.0–100.0)
PLATELETS: 154 10*3/uL (ref 150–400)
Platelets: 144 10*3/uL — ABNORMAL LOW (ref 150–400)
RBC: 3.4 MIL/uL — ABNORMAL LOW (ref 4.22–5.81)
RBC: 3.19 MIL/uL — AB (ref 4.22–5.81)
RDW: 16.5 % — AB (ref 11.5–15.5)
RDW: 16.4 % — ABNORMAL HIGH (ref 11.5–15.5)
WBC: 4.8 10*3/uL (ref 4.0–10.5)
WBC: 5.4 10*3/uL (ref 4.0–10.5)

## 2015-03-27 LAB — TYPE AND SCREEN
ABO/RH(D): A NEG
Antibody Screen: NEGATIVE
DAT, IGG: POSITIVE
PT AG Type: NEGATIVE
Unit division: 0
Unit division: 0

## 2015-03-27 MED ORDER — TECHNETIUM TC 99M-LABELED RED BLOOD CELLS IV KIT
25.0000 | PACK | Freq: Once | INTRAVENOUS | Status: AC | PRN
  Administered 2015-03-27: 25 via INTRAVENOUS

## 2015-03-27 NOTE — Progress Notes (Signed)
PATIENT DETAILS Name: Luke Sanchez Age: 81 y.o. Sex: male Date of Birth: 21-Feb-1915 Admit Date: 03/26/2015 Admitting Physician Clydie Braunondell A Smith, MD RUE:AVWU,JWJXBJPCP:HILL,GERALD K, MD  Subjective: 2 episodes of hematochezia this morning.  Assessment/Plan: Principal Problem: Lower GI bleed: Likely diverticular bleeding. Continue supportive care. Since continues to have hematochezia, have ordered a tagged RBC scan.Dr Sunnie Nielsenegalado had d/w Dr Ewing SchleinMagod 12/7  Active Problems: Acute blood loss anemia: Secondary to above, follow hemoglobin and transfuse if significant drop.  CAD S/P percutaneous coronary angioplasty: Without chest pain or shortness of breath, continue to hold all antiplatelets.  HTN (hypertension): Controlled with metoprolol, resume amlodipine when able.  HLD (hyperlipidemia): Continue statin.  Low back pain: Chronic issue, continue supportive measures. Nonfocal exam, seems to be stable today.  Recent fall: No syncope, PT eval completed-SNF on discharge.  BPH: Continue Proscar and Flomax.  Disposition: Remain inpatient-home vs SNF when hematochezia resolves  Antimicrobial agents  See below  Anti-infectives    None      DVT Prophylaxis: SCD's  Code Status: Full code  Family Communication Ms Spann-daughter-over the phone  Procedures: None  CONSULTS:  None  Time spent 30 minutes-Greater than 50% of this time was spent in counseling, explanation of diagnosis, planning of further management, and coordination of care.  MEDICATIONS: Scheduled Meds: . citalopram  20 mg Oral Daily  . finasteride  5 mg Oral Daily  . fluticasone  2 spray Each Nare Daily  . guaiFENesin  600 mg Oral BID  . metoprolol succinate  25 mg Oral Daily  . pantoprazole (PROTONIX) IV  40 mg Intravenous Q12H  . rosuvastatin  20 mg Oral q morning - 10a  . sodium chloride  3 mL Intravenous Q12H  . tamsulosin  0.4 mg Oral Daily  . triamcinolone cream  1 application Topical BID    Continuous Infusions: . sodium chloride 50 mL/hr at 03/26/15 1900   PRN Meds:.acetaminophen, loperamide, ondansetron **OR** ondansetron (ZOFRAN) IV    PHYSICAL EXAM: Vital signs in last 24 hours: Filed Vitals:   03/26/15 1357 03/26/15 1605 03/26/15 2201 03/27/15 0630  BP: 132/52 141/65 169/63 145/71  Pulse: 66 65 73 72  Temp: 98.3 F (36.8 C) 97.3 F (36.3 C) 98.3 F (36.8 C) 98.2 F (36.8 C)  TempSrc:  Oral Oral Oral  Resp: 16 16 17 18   Height:      Weight:      SpO2: 97% 100% 99% 98%    Weight change:  Filed Weights   03/26/15 0141  Weight: 68.947 kg (152 lb)   Body mass index is 20.61 kg/(m^2).   Gen Exam: Awake and alert with clear speech.  Neck: Supple, No JVD.   Chest: B/L Clear.   CVS: S1 S2 Regular.  Abdomen: soft, BS +, non tender, non distended.  Extremities: no edema, lower extremities warm to touch. Neurologic: Non Focal.   Skin: No Rash.   Wounds: N/A.   Intake/Output from previous day:  Intake/Output Summary (Last 24 hours) at 03/27/15 1316 Last data filed at 03/27/15 1009  Gross per 24 hour  Intake 1408.83 ml  Output   1100 ml  Net 308.83 ml     LAB RESULTS: CBC  Recent Labs Lab 03/26/15 0202 03/26/15 0644 03/26/15 1210 03/26/15 1640 03/27/15 1138  WBC 8.4  --   --   --  4.8  HGB 9.5* 8.9* 8.5* 8.9* 10.0*  HCT 30.8* 29.2* 27.9* 28.3*  32.9*  PLT 179  --   --   --  144*  MCV 99.4  --   --   --  96.8  MCH 30.6  --   --   --  29.4  MCHC 30.8  --   --   --  30.4  RDW 15.2  --   --   --  16.5*  LYMPHSABS 1.3  --   --   --   --   MONOABS 0.9  --   --   --   --   EOSABS 0.1  --   --   --   --   BASOSABS 0.0  --   --   --   --     Chemistries   Recent Labs Lab 03/26/15 0202  NA 137  K 4.3  CL 105  CO2 21*  GLUCOSE 114*  BUN 26*  CREATININE 1.39*  CALCIUM 9.3    CBG: No results for input(s): GLUCAP in the last 168 hours.  GFR Estimated Creatinine Clearance: 28.9 mL/min (by C-G formula based on Cr of  1.39).  Coagulation profile  Recent Labs Lab 03/26/15 0202  INR 1.14    Cardiac Enzymes No results for input(s): CKMB, TROPONINI, MYOGLOBIN in the last 168 hours.  Invalid input(s): CK  Invalid input(s): POCBNP No results for input(s): DDIMER in the last 72 hours. No results for input(s): HGBA1C in the last 72 hours. No results for input(s): CHOL, HDL, LDLCALC, TRIG, CHOLHDL, LDLDIRECT in the last 72 hours. No results for input(s): TSH, T4TOTAL, T3FREE, THYROIDAB in the last 72 hours.  Invalid input(s): FREET3 No results for input(s): VITAMINB12, FOLATE, FERRITIN, TIBC, IRON, RETICCTPCT in the last 72 hours. No results for input(s): LIPASE, AMYLASE in the last 72 hours.  Urine Studies No results for input(s): UHGB, CRYS in the last 72 hours.  Invalid input(s): UACOL, UAPR, USPG, UPH, UTP, UGL, UKET, UBIL, UNIT, UROB, ULEU, UEPI, UWBC, URBC, UBAC, CAST, UCOM, BILUA  MICROBIOLOGY: Recent Results (from the past 240 hour(s))  Urine culture     Status: None   Collection Time: 03/26/15  4:24 AM  Result Value Ref Range Status   Specimen Description URINE, CLEAN CATCH  Final   Special Requests NONE  Final   Culture MULTIPLE SPECIES PRESENT, SUGGEST RECOLLECTION  Final   Report Status 03/27/2015 FINAL  Final    RADIOLOGY STUDIES/RESULTS: Dg Chest 2 View  03/26/2015  CLINICAL DATA:  81 year old male with fall and left shoulder pain EXAM: CHEST  2 VIEW COMPARISON:  Chest radiograph dated 01/03/2014 FINDINGS: Two views of the chest do not demonstrate any focal consolidation, pleural effusion, or pneumothorax. Stable cardiomegaly. Degenerative changes of the spine. No acute fracture. IMPRESSION: No active cardiopulmonary disease. Electronically Signed   By: Elgie Collard M.D.   On: 03/26/2015 02:58   Dg Shoulder Left  03/26/2015  CLINICAL DATA:  Status post fall, with left shoulder pain. Initial encounter. EXAM: LEFT SHOULDER - 2+ VIEW COMPARISON:  Left shoulder radiographs  performed 09/02/2010 FINDINGS: There is no evidence of fracture or dislocation. The left humeral head is seated within the glenoid fossa. The left acromioclavicular joint demonstrates mild degenerative change, with a small overlying osseous fragment. No significant soft tissue abnormalities are seen. The visualized portions of the left lung are clear. IMPRESSION: No evidence of fracture or dislocation. Chronic degenerative change noted at the left acromioclavicular joint. Electronically Signed   By: Roanna Raider M.D.   On: 03/26/2015 02:57  Jeoffrey Massed, MD  [facility] Pager:[phone]  If 7PM-7AM, please contact night-coverage [email or web address] Password [email or web address] 03/27/2015, 1:16 PM   LOS: 1 day

## 2015-03-28 ENCOUNTER — Encounter (HOSPITAL_COMMUNITY): Payer: Self-pay | Admitting: Gastroenterology

## 2015-03-28 LAB — CBC
HCT: 29.8 % — ABNORMAL LOW (ref 39.0–52.0)
HCT: 29.9 % — ABNORMAL LOW (ref 39.0–52.0)
HEMOGLOBIN: 9.1 g/dL — AB (ref 13.0–17.0)
Hemoglobin: 9.2 g/dL — ABNORMAL LOW (ref 13.0–17.0)
MCH: 29.6 pg (ref 26.0–34.0)
MCH: 29.8 pg (ref 26.0–34.0)
MCHC: 30.5 g/dL (ref 30.0–36.0)
MCHC: 30.8 g/dL (ref 30.0–36.0)
MCV: 97.1 fL (ref 78.0–100.0)
MCV: 96.8 fL (ref 78.0–100.0)
PLATELETS: 141 10*3/uL — AB (ref 150–400)
PLATELETS: 147 10*3/uL — AB (ref 150–400)
RBC: 3.07 MIL/uL — AB (ref 4.22–5.81)
RBC: 3.09 MIL/uL — AB (ref 4.22–5.81)
RDW: 16.2 % — ABNORMAL HIGH (ref 11.5–15.5)
RDW: 15.7 % — ABNORMAL HIGH (ref 11.5–15.5)
WBC: 4.6 10*3/uL (ref 4.0–10.5)
WBC: 6.2 10*3/uL (ref 4.0–10.5)

## 2015-03-28 LAB — BASIC METABOLIC PANEL
ANION GAP: 8 (ref 5–15)
BUN: 13 mg/dL (ref 6–20)
CALCIUM: 8.6 mg/dL — AB (ref 8.9–10.3)
CO2: 25 mmol/L (ref 22–32)
Chloride: 102 mmol/L (ref 101–111)
Creatinine, Ser: 1.2 mg/dL (ref 0.61–1.24)
GFR, EST AFRICAN AMERICAN: 56 mL/min — AB (ref >60)
GFR, EST NON AFRICAN AMERICAN: 48 mL/min — AB (ref >60)
GLUCOSE: 88 mg/dL (ref 65–99)
Potassium: 4.1 mmol/L (ref 3.5–5.1)
Sodium: 135 mmol/L (ref 135–145)

## 2015-03-28 MED ORDER — MENTHOL 3 MG MT LOZG: 1.0000 | LOZENGE | OROMUCOSAL | Status: DC | PRN

## 2015-03-28 NOTE — Clinical Social Work Placement (Signed)
   CLINICAL SOCIAL WORK PLACEMENT  NOTE  Date:  03/28/2015  Patient Details  Name: Luke Sanchez MRN: [id number] Date of Birth: 06/07/1914  Clinical Social Work is seeking post-discharge placement for this patient at the Skilled  Nursing Facility level of care (*CSW will initial, date and re-position this form in  chart as items are completed):  Yes   Patient/family provided with [facility] Clinical Social Work Department's list of facilities offering this level of care within the geographic area requested by the patient (or if unable, by the patient's family).  Yes   Patient/family informed of their freedom to choose among providers that offer the needed level of care, that participate in Medicare, Medicaid or managed care program needed by the patient, have an available bed and are willing to accept the patient.  Yes   Patient/family informed of [facility]'s ownership interest in [facility] and [facility], as well as of the fact that they are under no obligation to receive care at these facilities.  PASRR submitted to EDS on       PASRR number received on 03/28/15     Existing PASRR number confirmed on 03/28/15     FL2 transmitted to all facilities in geographic area requested by pt/family on 03/28/15     FL2 transmitted to all facilities within larger geographic area on       Patient informed that his/her managed care company has contracts with or will negotiate with certain facilities, including the following:            Patient/family informed of bed offers received.  Patient chooses bed at       Physician recommends and patient chooses bed at      Patient to be transferred to   on  .  Patient to be transferred to facility by       Patient family notified on   of transfer.  Name of family member notified:        PHYSICIAN Please sign FL2     Additional Comment:    _______________________________________________ Vaughan BrownerNixon, Oretta Berkland A, LCSW 03/28/2015, 3:47  PM

## 2015-03-28 NOTE — Clinical Social Work Note (Signed)
Clinical Social Work Assessment  Patient Details  Name: Luke Sanchez MRN: [id number] Date of Birth: [birth year]  Date of referral:  03/28/15               Reason for consult:  Facility Placement, Discharge Planning                Permission sought to share information with:  Family Supports, Magazine features editoracility Contact Representative, Case Estate manager/land agentManager Permission granted to share information::  Yes, Verbal Permission Granted  Name::      [name](Tashana Spann )  Agency::   (SNF)  Relationship::   (Granddaughter )  Contact Information:   [phone])  Housing/Transportation Living arrangements for the past 2 months:  Single Family Home Source of Information:   Publishing rights manager(Granddaughter ) Patient Interpreter Needed:  None Criminal Activity/Legal Involvement Pertinent to Current Situation/Hospitalization:  No - Comment as needed Significant Relationships:  Adult Children, Other Family Members Lives with:  Adult Children Do you feel safe going back to the place where you live?  Yes Need for family participation in patient care:  Yes (Comment)  Care giving concerns:  Patient requiring short-term rehab placement at SNF.    Social Worker assessment / plan:  Visual merchandiserClinical Social Worker spoke with patient's granddtr in reference to post-acute placement. CSW introduced CSW role and SNF process. Pt's granddt reported that [facility]/prefers for pt to return however she would like to review all options with family for making final decision. Pt's granddtr also reported that pt lives full-time with his dtr, Nettie ElmSylvia. No further concerns reported at this time. CSW will continue to follow pt and pt's family for continued support and to facilitate pt's discharge needs once medically stable.   Employment status:  Retired Health and safety inspectornsurance information:  Medicare PT Recommendations:  Skilled Nursing Facility Information / Referral to community resources:  Skilled Nursing Facility  Patient/Family's  Response to care:  Pt's granddtr and family agreeable to SNF placement. Pt's granddtr pleasant and appreciated social work intervention. Family supportive and involved in pt's care.   Patient/Family's Understanding of and Emotional Response to Diagnosis, Current Treatment, and Prognosis:  Pt's granddtr at bedside and knowledgeable of medical interventions.   Emotional Assessment Appearance:  Appears stated age Attitude/Demeanor/Rapport:  Unable to Assess Affect (typically observed):  Unable to Assess Orientation:  Oriented to Situation, Oriented to  Time, Oriented to Place, Oriented to Self Alcohol / Substance use:  Not Applicable Psych involvement (Current and /or in the community):  No (Comment)  Discharge Needs  Concerns to be addressed:  Care Coordination Readmission within the last 30 days:  No Current discharge risk:  Dependent with Mobility Barriers to Discharge:  Continued Medical Work up   The Sherwin-WilliamsBashira Ziv Welchel, MSW, LCSWA [phone] 03/28/2015 3:42 PM

## 2015-03-28 NOTE — Consult Note (Signed)
Reason for Consult: Lower GI bleeding Referring Physician: Hospital team  Luke Sanchez is an 81 y.o. male.  HPI: Very nice gentleman seen for lower GI bleeding who had a negative bleeding scan yesterday but with a little more bright red blood the hospitalist were going to call nuclear medicine to see if some delayed films will be helpful and he said he was not having any GI complaints at home except for maybe a little constipation and straining and denies any aspirin or nonsteroidals or blood thinners and does use Tylenol for his pain and his previous colonoscopy in 2011 by a different gastroenterologist was reviewed and his hospital computer chart was reviewed and he believes his bleeding is slowing down and he has no other complaints and specifically no upper tract symptoms  Past Medical History  Diagnosis Date  . Hypertension   . Bronchitis   . Arthritis   . Pneumonia     hx of  . CAD (coronary artery disease)     stent RCA 2010-[name]  . UTI (lower urinary tract infection)     Past Surgical History  Procedure Laterality Date  . Coronary stent placement    . Hernia repair      x 2    History reviewed. No pertinent family history.  Social History:  reports that he has never smoked. He has quit using smokeless tobacco. His smokeless tobacco use included Snuff and Chew. He reports that he does not drink alcohol or use illicit drugs.  Allergies:  Allergies  Allergen Reactions  . Penicillins Anaphylaxis    Has patient had a PCN reaction causing immediate rash, facial/tongue/throat swelling, SOB or lightheadedness with hypotension: No Has patient had a PCN reaction causing severe rash involving mucus membranes or skin necrosis: No Has patient had a PCN reaction that required hospitalization No Has patient had a PCN reaction occurring within the last 10 years: No If all of the above answers are "NO", then may proceed with Cephalosporin use.    Medications: I have reviewed the  patient's current medications.  Results for orders placed or performed during the hospital encounter of 03/26/15 (from the past 48 hour(s))  Hemoglobin and hematocrit, blood     Status: Abnormal   Collection Time: 03/26/15  4:40 PM  Result Value Ref Range   Hemoglobin 8.9 (L) 13.0 - 17.0 g/dL   HCT 28.3 (L) 39.0 - 52.0 %  CBC     Status: Abnormal   Collection Time: 03/27/15 11:38 AM  Result Value Ref Range   WBC 4.8 4.0 - 10.5 K/uL   RBC 3.40 (L) 4.22 - 5.81 MIL/uL   Hemoglobin 10.0 (L) 13.0 - 17.0 g/dL   HCT 32.9 (L) 39.0 - 52.0 %   MCV 96.8 78.0 - 100.0 fL   MCH 29.4 26.0 - 34.0 pg   MCHC 30.4 30.0 - 36.0 g/dL   RDW 16.5 (H) 11.5 - 15.5 %   Platelets 144 (L) 150 - 400 K/uL  CBC     Status: Abnormal   Collection Time: 03/27/15  6:25 PM  Result Value Ref Range   WBC 5.4 4.0 - 10.5 K/uL   RBC 3.19 (L) 4.22 - 5.81 MIL/uL   Hemoglobin 9.7 (L) 13.0 - 17.0 g/dL   HCT 30.6 (L) 39.0 - 52.0 %   MCV 95.9 78.0 - 100.0 fL   MCH 30.4 26.0 - 34.0 pg   MCHC 31.7 30.0 - 36.0 g/dL   RDW 16.4 (H) 11.5 - 15.5 %  Platelets 154 150 - 400 K/uL  Basic metabolic panel     Status: Abnormal   Collection Time: 03/28/15  6:18 AM  Result Value Ref Range   Sodium 135 135 - 145 mmol/L   Potassium 4.1 3.5 - 5.1 mmol/L   Chloride 102 101 - 111 mmol/L   CO2 25 22 - 32 mmol/L   Glucose, Bld 88 65 - 99 mg/dL   BUN 13 6 - 20 mg/dL   Creatinine, Ser 1.20 0.61 - 1.24 mg/dL   Calcium 8.6 (L) 8.9 - 10.3 mg/dL   GFR calc non Af Amer 48 (L) >60 mL/min   GFR calc Af Amer 56 (L) >60 mL/min    Comment: (NOTE) The eGFR has been calculated using the CKD EPI equation. This calculation has not been validated in all clinical situations. eGFR's persistently <60 mL/min signify possible Chronic Kidney Disease.    Anion gap 8 5 - 15  CBC     Status: Abnormal   Collection Time: 03/28/15  6:18 AM  Result Value Ref Range   WBC 4.6 4.0 - 10.5 K/uL   RBC 3.07 (L) 4.22 - 5.81 MIL/uL   Hemoglobin 9.1 (L) 13.0 - 17.0  g/dL   HCT 29.8 (L) 39.0 - 52.0 %   MCV 97.1 78.0 - 100.0 fL   MCH 29.6 26.0 - 34.0 pg   MCHC 30.5 30.0 - 36.0 g/dL   RDW 16.2 (H) 11.5 - 15.5 %   Platelets 141 (L) 150 - 400 K/uL    Nm Gi Blood Loss  03/27/2015  CLINICAL DATA:  Gastrointestinal bleeding. Lower GI bleed. History diverticular bleed. EXAM: NUCLEAR MEDICINE GASTROINTESTINAL BLEEDING SCAN TECHNIQUE: Sequential abdominal images were obtained following intravenous administration of Tc-63m labeled red blood cells. RADIOPHARMACEUTICALS:  Twenty-five mCi Tc-18m in-vitro labeled red cells. COMPARISON:  CT 03/03/2011 FINDINGS: Dynamic planar imaging of the abdomen was acquired over 2 hours. There is no accumulation of radiotracer to suggest active bleeding into the gastrointestinal tract. The blood pool activity is noted in the aorta and IVC. The GU system is identified. IMPRESSION: No scintigraphic evidence of active gastrointestinal bleeding. Electronically Signed   By: Suzy Bouchard M.D.   On: 03/27/2015 18:21    ROSNegative except above  Blood pressure 162/71, pulse 64, temperature 97.9 F (36.6 C), temperature source Oral, resp. rate 17, height 6' (1.829 m), weight 68.947 kg (152 lb), SpO2 98 %. Physical Exam Vital signs stable afebrile no acute distress lungs are clear heart regular rate and rhythm abdomen is soft nontender hemoglobin slow decrease negative bleeding scan yesterday await delayed images  Assessment/Plan: Probable diverticular bleeding Plan: Agree with liquids only and await delayed scan and we discussed repeat colonoscopy which neither of [place] want to do but if bleeding continues that would probably be the next step and will follow with you  Mariyah Upshaw E 03/28/2015, 1:15 PM

## 2015-03-28 NOTE — Progress Notes (Signed)
PATIENT DETAILS Name: Luke Sanchez Age: 81 y.o. Sex: male Date of Birth: [birth year] Admit Date: 03/26/2015 Admitting Physician Clydie Braun, MD ZOX:[id number],AVWUJW K, MD  Subjective: Although hematochezia has slowed down-had 2 episodes of hematochezia with maroon colored stools this morning.  Assessment/Plan: Principal Problem: Lower GI bleed: Likely diverticular bleeding. Continue supportive care. Bleeding scan neg-spoke with Dr Denny Peon advised to see if we could have Nuclear medicine take delayed images. He will see patient and provide further recommendations.  Active Problems: Acute blood loss anemia: Secondary to above, follow hemoglobin and transfuse if significant drop.Hb stable  CAD S/P percutaneous coronary angioplasty: Without chest pain or shortness of breath, continue to hold all antiplatelets.  HTN (hypertension): Moderately controlled with metoprolol, resume amlodipine when able.  HLD (hyperlipidemia): Continue statin.  Low back pain: Chronic issue, continue supportive measures. Nonfocal exam, seems to be stable today.  Recent fall: No syncope, PT eval completed-SNF on discharge.  BPH: Continue Proscar and Flomax.  Disposition: Remain inpatient-home vs SNF when hematochezia resolves  Antimicrobial agents  See below  Anti-infectives    None      DVT Prophylaxis: SCD's  Code Status: Full code  Family Communication None at bedside  Procedures: None  CONSULTS:  None  Time spent 25 minutes-Greater than 50% of this time was spent in counseling, explanation of diagnosis, planning of further management, and coordination of care.  MEDICATIONS: Scheduled Meds: . citalopram  20 mg Oral Daily  . finasteride  5 mg Oral Daily  . fluticasone  2 spray Each Nare Daily  . guaiFENesin  600 mg Oral BID  . metoprolol succinate  25 mg Oral Daily  . pantoprazole (PROTONIX) IV  40 mg Intravenous Q12H  . rosuvastatin  20 mg Oral q morning -  10a  . sodium chloride  3 mL Intravenous Q12H  . tamsulosin  0.4 mg Oral Daily  . triamcinolone cream  1 application Topical BID   Continuous Infusions: . sodium chloride 40 mL/hr at 03/28/15 0633   PRN Meds:.acetaminophen, loperamide, ondansetron **OR** ondansetron (ZOFRAN) IV    PHYSICAL EXAM: Vital signs in last 24 hours: Filed Vitals:   03/27/15 0630 03/27/15 1429 03/27/15 2148 03/28/15 0546  BP: 145/71 140/65 146/88 162/71  Pulse: 72 73 63 64  Temp: 98.2 F (36.8 C) 98.2 F (36.8 C) 97.9 F (36.6 C) 97.9 F (36.6 C)  TempSrc: Oral Oral Oral Oral  Resp: Height:      Weight:      SpO2: 98% 98% 98% 98%    Weight change:  Filed Weights   03/26/15 0141  Weight: 68.947 kg (152 lb)   Body mass index is 20.61 kg/(m^2).   Gen Exam: Awake and alert with clear speech.  Neck: Supple, No JVD.   Chest: B/L Clear.   CVS: S1 S2 Regular.  Abdomen: soft, BS +, non tender, non distended.  Extremities: no edema, lower extremities warm to touch. Neurologic: Non Focal.   Skin: No Rash.   Wounds: N/A.   Intake/Output from previous day:  Intake/Output Summary (Last 24 hours) at 03/28/15 1059 Last data filed at 03/28/15 0850  Gross per 24 hour  Intake   2120 ml  Output   2900 ml  Net   -780 ml     LAB RESULTS: CBC  Recent Labs Lab 03/26/15 0202  03/26/15 1210 03/26/15 1640 03/27/15 1138 03/27/15 1825 03/28/15 1191  WBC 8.4  --   --   --  4.8 5.4 4.6  HGB 9.5*  < > 8.5* 8.9* 10.0* 9.7* 9.1*  HCT 30.8*  < > 27.9* 28.3* 32.9* 30.6* 29.8*  PLT 179  --   --   --  144* 154 141*  MCV 99.4  --   --   --  96.8 95.9 97.1  MCH 30.6  --   --   --  29.4 30.4 29.6  MCHC 30.8  --   --   --  30.4 31.7 30.5  RDW 15.2  --   --   --  16.5* 16.4* 16.2*  LYMPHSABS 1.3  --   --   --   --   --   --   MONOABS 0.9  --   --   --   --   --   --   EOSABS 0.1  --   --   --   --   --   --   BASOSABS 0.0  --   --   --   --   --   --   < > = values in this interval not  displayed.  Chemistries   Recent Labs Lab 03/26/15 0202 03/28/15 0618  NA 137 135  K 4.3 4.1  CL 105 102  CO2 21* 25  GLUCOSE 114* 88  BUN 26* 13  CREATININE 1.39* 1.20  CALCIUM 9.3 8.6*    CBG: No results for input(s): GLUCAP in the last 168 hours.  GFR Estimated Creatinine Clearance: 33.5 mL/min (by C-G formula based on Cr of 1.2).  Coagulation profile  Recent Labs Lab 03/26/15 0202  INR 1.14    Cardiac Enzymes No results for input(s): CKMB, TROPONINI, MYOGLOBIN in the last 168 hours.  Invalid input(s): CK  Invalid input(s): POCBNP No results for input(s): DDIMER in the last 72 hours. No results for input(s): HGBA1C in the last 72 hours. No results for input(s): CHOL, HDL, LDLCALC, TRIG, CHOLHDL, LDLDIRECT in the last 72 hours. No results for input(s): TSH, T4TOTAL, T3FREE, THYROIDAB in the last 72 hours.  Invalid input(s): FREET3 No results for input(s): VITAMINB12, FOLATE, FERRITIN, TIBC, IRON, RETICCTPCT in the last 72 hours. No results for input(s): LIPASE, AMYLASE in the last 72 hours.  Urine Studies No results for input(s): UHGB, CRYS in the last 72 hours.  Invalid input(s): UACOL, UAPR, USPG, UPH, UTP, UGL, UKET, UBIL, UNIT, UROB, ULEU, UEPI, UWBC, URBC, UBAC, CAST, UCOM, BILUA  MICROBIOLOGY: Recent Results (from the past 240 hour(s))  Urine culture     Status: None   Collection Time: 03/26/15  4:24 AM  Result Value Ref Range Status   Specimen Description URINE, CLEAN CATCH  Final   Special Requests NONE  Final   Culture MULTIPLE SPECIES PRESENT, SUGGEST RECOLLECTION  Final   Report Status 03/27/2015 FINAL  Final    RADIOLOGY STUDIES/RESULTS: Dg Chest 2 View  03/26/2015  CLINICAL DATA:  81 year old male with fall and left shoulder pain EXAM: CHEST  2 VIEW COMPARISON:  Chest radiograph dated 01/03/2014 FINDINGS: Two views of the chest do not demonstrate any focal consolidation, pleural effusion, or pneumothorax. Stable cardiomegaly.  Degenerative changes of the spine. No acute fracture. IMPRESSION: No active cardiopulmonary disease. Electronically Signed   By: Elgie Collard M.D.   On: 03/26/2015 02:58   Nm Gi Blood Loss  03/27/2015  CLINICAL DATA:  Gastrointestinal bleeding. Lower GI bleed. History diverticular bleed. EXAM: NUCLEAR MEDICINE GASTROINTESTINAL BLEEDING SCAN  TECHNIQUE: Sequential abdominal images were obtained following intravenous administration of Tc-8164m labeled red blood cells. RADIOPHARMACEUTICALS:  Twenty-five mCi Tc-4964m in-vitro labeled red cells. COMPARISON:  CT 03/03/2011 FINDINGS: Dynamic planar imaging of the abdomen was acquired over 2 hours. There is no accumulation of radiotracer to suggest active bleeding into the gastrointestinal tract. The blood pool activity is noted in the aorta and IVC. The GU system is identified. IMPRESSION: No scintigraphic evidence of active gastrointestinal bleeding. Electronically Signed   By: Genevive BiStewart  Edmunds M.D.   On: 03/27/2015 18:21   Dg Shoulder Left  03/26/2015  CLINICAL DATA:  Status post fall, with left shoulder pain. Initial encounter. EXAM: LEFT SHOULDER - 2+ VIEW COMPARISON:  Left shoulder radiographs performed 09/02/2010 FINDINGS: There is no evidence of fracture or dislocation. The left humeral head is seated within the glenoid fossa. The left acromioclavicular joint demonstrates mild degenerative change, with a small overlying osseous fragment. No significant soft tissue abnormalities are seen. The visualized portions of the left lung are clear. IMPRESSION: No evidence of fracture or dislocation. Chronic degenerative change noted at the left acromioclavicular joint. Electronically Signed   By: Roanna RaiderJeffery  Chang M.D.   On: 03/26/2015 02:57    Jeoffrey MassedGHIMIRE,Jacai Kipp, MD  [facility] Pager:[phone]  If 7PM-7AM, please contact night-coverage [email or web address] Password [email or web address] 03/28/2015, 10:59 AM   LOS: 2 days

## 2015-03-28 NOTE — NC FL2 (Signed)
[place] MEDICAID FL2 LEVEL OF CARE SCREENING TOOL     IDENTIFICATION  Patient Name: Luke Sanchez Birthdate: 06/06/1914 Sex: male Admission Date (Current Location): 03/26/2015  [facility] and [place] Number: [employer]   Facility and Address:  [facility] [place], [address] [place], [place] [address]      Provider Number: [id number]  Attending Physician Name and Address:  Maretta Bees, MD  Relative Name and Phone Number:       Current Level of Care: Hospital Recommended Level of Care: Skilled Nursing Facility Prior Approval Number:    Date Approved/Denied:   PASRR Number: [id number]  Discharge Plan: SNF    Current Diagnoses: Patient Active Problem List   Diagnosis Date Noted  . GI bleed 03/26/2015  . Fall 03/26/2015  . BPH (benign prostatic hyperplasia) 03/26/2015  . Intractable low back pain 01/04/2014  . HLD (hyperlipidemia) 01/04/2014  . Normocytic normochromic anemia 01/04/2014  . Low back pain 01/04/2014  . HTN (hypertension) 06/17/2012  . Overdose 06/17/2012  . Aortic valve stenosis 03/03/2011  . CAD S/P percutaneous coronary angioplasty 03/03/2011  . GIB (gastrointestinal bleeding) 03/03/2011  . Abdominal pain, left lower quadrant 03/03/2011  . Acute blood loss anemia 03/03/2011  . Diverticulitis of colon with bleeding 03/03/2011    Orientation RESPIRATION BLADDER Height & Weight    Self, Time, Situation, Place  Normal Incontinent 6' (182.9 cm) 152 lbs.  BEHAVIORAL SYMPTOMS/MOOD NEUROLOGICAL BOWEL NUTRITION STATUS   (NONE)  (NONE) Incontinent Diet (FULL LIQUID)  AMBULATORY STATUS COMMUNICATION OF NEEDS Skin   Extensive Assist Verbally Normal                       Personal Care Assistance Level of Assistance  Bathing, Dressing Bathing Assistance: Maximum assistance   Dressing Assistance: Maximum assistance     Functional Limitations Info   (NONE)          SPECIAL CARE FACTORS FREQUENCY  PT (By licensed PT)      PT Frequency: 3              Contractures      Additional Factors Info  Code Status, Allergies Code Status Info: FULL CODE  Allergies Info: PENICILLINS           Current Medications (03/28/2015):  This is the current hospital active medication list Current Facility-Administered Medications  Medication Dose Route Frequency Provider Last Rate Last Dose  . 0.9 %  sodium chloride infusion   Intravenous Continuous Maretta Bees, MD 40 mL/hr at 03/28/15 [phone]    . acetaminophen (TYLENOL) tablet 650 mg  650 mg Oral Q6H PRN Clydie Braun, MD   650 mg at 03/27/15 2009  . citalopram (CELEXA) tablet 20 mg  20 mg Oral Daily Clydie Braun, MD   20 mg at 03/28/15 0932  . finasteride (PROSCAR) tablet 5 mg  5 mg Oral Daily Clydie Braun, MD   5 mg at 03/28/15 0932  . fluticasone (FLONASE) 50 MCG/ACT nasal spray 2 spray  2 spray Each Nare Daily Clydie Braun, MD   2 spray at 03/27/15 0906  . guaiFENesin (MUCINEX) 12 hr tablet 600 mg  600 mg Oral BID Belkys A Regalado, MD   600 mg at 03/28/15 0932  . loperamide (IMODIUM) capsule 2 mg  2 mg Oral QID PRN Clydie Braun, MD      . menthol-cetylpyridinium (CEPACOL) lozenge 3 mg  1 lozenge Oral PRN Shanker Levora Dredge,  MD      . metoprolol succinate (TOPROL-XL) 24 hr tablet 25 mg  25 mg Oral Daily Clydie Braunondell A Smith, MD   25 mg at 03/28/15 0932  . ondansetron (ZOFRAN) tablet 4 mg  4 mg Oral Q6H PRN Clydie Braunondell A Smith, MD       Or  . ondansetron (ZOFRAN) injection 4 mg  4 mg Intravenous Q6H PRN Rondell A Katrinka BlazingSmith, MD      . pantoprazole (PROTONIX) injection 40 mg  40 mg Intravenous Q12H Clydie Braunondell A Smith, MD   40 mg at 03/28/15 0934  . rosuvastatin (CRESTOR) tablet 20 mg  20 mg Oral q morning - 10a Clydie Braunondell A Smith, MD   20 mg at 03/28/15 0932  . sodium chloride 0.9 % injection 3 mL  3 mL Intravenous Q12H Rondell Burtis JunesA Smith, MD   3 mL at 03/26/15 2200  . tamsulosin (FLOMAX) capsule 0.4 mg  0.4 mg Oral Daily Clydie Braunondell A Smith, MD   0.4 mg at 03/28/15 0932   . triamcinolone cream (KENALOG) 0.1 % 1 application  1 application Topical BID Clydie Braunondell A Smith, MD   1 application at 03/28/15 1335     Discharge Medications: Please see discharge summary for a list of discharge medications.  Relevant Imaging Results:  Relevant Lab Results:   Additional Information SSN [id number]  Derenda FennelBashira Kenidy Crossland, MSW, LCSWA [phone] 03/28/2015 3:09 PM

## 2015-03-28 NOTE — Progress Notes (Signed)
Physical Therapy Treatment Patient Details Name: Luke Sanchez MRN: [id number] DOB: [birth year] Today's Date: 03/28/2015    History of Present Illness pt presents after a fall missing his chair while sitting. Dx of lower GIB.    PT Comments    Pt gradually progressing with mobility. He ambulated 70' with RW today with a shuffling gait pattern. Distance limited by fatigue. He is agreeable to ST-SNF.   Follow Up Recommendations  SNF     Equipment Recommendations  None recommended by PT    Recommendations for Other Services       Precautions / Restrictions Precautions Precautions: Fall Restrictions Weight Bearing Restrictions: No    Mobility  Bed Mobility Overal bed mobility: Needs Assistance;+2 for physical assistance Bed Mobility: Rolling;Sidelying to Sit Rolling: Min assist Sidelying to sit: Mod assist       General bed mobility comments: verbal/manual cues for technique, mod A to raise trunk, pt reports L arm weakness (he stated he was lying on that arm after fall)  Transfers Overall transfer level: Needs assistance Equipment used: Rolling walker (2 wheeled) Transfers: Sit to/from Stand Sit to Stand: Min assist;+2 physical assistance;From elevated surface         General transfer comment: verbal cues for hand and foot placement  Ambulation/Gait   Ambulation Distance (Feet): 10 Feet Assistive device: 2 person hand held assist Gait Pattern/deviations: Shuffle;Wide base of support;Step-to pattern;Decreased step length - right;Decreased step length - left     General Gait Details: verbal cues to increase step length but pt unable to do so, very short, shuffling steps with wide BOS, no LOB, verbal cues to step into RW   Stairs            Wheelchair Mobility    Modified Rankin (Stroke Patients Only)       Balance     Sitting balance-Leahy Scale: Fair       Standing balance-Leahy Scale: Poor                      Cognition  Arousal/Alertness: Awake/alert Behavior During Therapy: WFL for tasks assessed/performed Overall Cognitive Status: Within Functional Limits for tasks assessed                      Exercises      General Comments        Pertinent Vitals/Pain Pain Assessment: No/denies pain    Home Living                      Prior Function            PT Goals (current goals can now be found in the care plan section) Acute Rehab PT Goals Patient Stated Goal: Get my strength back PT Goal Formulation: With patient Time For Goal Achievement: 04/09/15 Potential to Achieve Goals: Fair Progress towards PT goals: Progressing toward goals    Frequency  Min 3X/week    PT Plan Current plan remains appropriate    Co-evaluation             End of Session Equipment Utilized During Treatment: Gait belt Activity Tolerance: Patient limited by fatigue Patient left: with call bell/phone within reach;in chair     Time: 4098-1191 PT Time Calculation (min) (ACUTE ONLY): 18 min  Charges:  $Gait Training: 8-22 mins                    G Codes:  Ralene BatheUhlenberg, Luke Sanchez 03/28/2015, 10:30 AM [phone]

## 2015-03-28 NOTE — Clinical Social Work Note (Signed)
Clinical Social Worker has assessed patient with pt's granddaughter, Ginny Forthashana. Family agreeable to SNF. Full psychosocial assessment to follow.  CSW to complete FL-2 and remains available as needed.   Derenda FennelBashira Carle Fenech, MSW, LCSWA [phone] 03/28/2015 12:30 PM

## 2015-03-29 LAB — CBC
HCT: 29.7 % — ABNORMAL LOW (ref 39.0–52.0)
HEMATOCRIT: 27.2 % — AB (ref 39.0–52.0)
HEMATOCRIT: 30.8 % — AB (ref 39.0–52.0)
HEMOGLOBIN: 8.5 g/dL — AB (ref 13.0–17.0)
Hemoglobin: 9.4 g/dL — ABNORMAL LOW (ref 13.0–17.0)
Hemoglobin: 9.2 g/dL — ABNORMAL LOW (ref 13.0–17.0)
MCH: 30.1 pg (ref 26.0–34.0)
MCH: 29.7 pg (ref 26.0–34.0)
MCH: 30.1 pg (ref 26.0–34.0)
MCHC: 31.3 g/dL (ref 30.0–36.0)
MCHC: 30.5 g/dL (ref 30.0–36.0)
MCHC: 31 g/dL (ref 30.0–36.0)
MCV: 96.5 fL (ref 78.0–100.0)
MCV: 97.5 fL (ref 78.0–100.0)
MCV: 97.1 fL (ref 78.0–100.0)
PLATELETS: 143 10*3/uL — AB (ref 150–400)
PLATELETS: 140 10*3/uL — AB (ref 150–400)
Platelets: 128 10*3/uL — ABNORMAL LOW (ref 150–400)
RBC: 2.82 MIL/uL — ABNORMAL LOW (ref 4.22–5.81)
RBC: 3.16 MIL/uL — ABNORMAL LOW (ref 4.22–5.81)
RBC: 3.06 MIL/uL — ABNORMAL LOW (ref 4.22–5.81)
RDW: 15.7 % — AB (ref 11.5–15.5)
RDW: 15.5 % (ref 11.5–15.5)
RDW: 15.4 % (ref 11.5–15.5)
WBC: 4.3 10*3/uL (ref 4.0–10.5)
WBC: 5.9 10*3/uL (ref 4.0–10.5)
WBC: 5.1 10*3/uL (ref 4.0–10.5)

## 2015-03-29 NOTE — Progress Notes (Signed)
Subjective: Couple small bloody stools over past 24 hours. No abdominal pain.  Objective: Vital signs in last 24 hours: Temp:  [98.2 F (36.8 C)-98.6 F (37 C)] 98.6 F (37 C) (12/10 1400) Pulse Rate:  [57-71] 71 (12/10 1400) Resp:  [16-17] 16 (12/10 1400) BP: (128-186)/(58-75) 186/75 mmHg (12/10 1400) SpO2:  [96 %-98 %] 98 % (12/10 1400) Weight change:  Last BM Date: 03/29/15  PE: GEN:  NAD, elderly ABD:  Soft, hyperactive bowel sounds  Lab Results: CBC    Component Value Date/Time   WBC 5.9 03/29/2015 1340   RBC 3.16* 03/29/2015 1340   HGB 9.4* 03/29/2015 1340   HCT 30.8* 03/29/2015 1340   PLT 143* 03/29/2015 1340   MCV 97.5 03/29/2015 1340   MCH 29.7 03/29/2015 1340   MCHC 30.5 03/29/2015 1340   RDW 15.5 03/29/2015 1340   LYMPHSABS 1.3 03/26/2015 0202   MONOABS 0.9 03/26/2015 0202   EOSABS 0.1 03/26/2015 0202   BASOSABS 0.0 03/26/2015 0202   CMP     Component Value Date/Time   NA 135 03/28/2015 0618   K 4.1 03/28/2015 0618   CL 102 03/28/2015 0618   CO2 25 03/28/2015 0618   GLUCOSE 88 03/28/2015 0618   BUN 13 03/28/2015 0618   CREATININE 1.20 03/28/2015 0618   CALCIUM 8.6* 03/28/2015 0618   PROT 7.6 03/09/2014 1339   ALBUMIN 3.3* 03/09/2014 1339   AST 19 03/09/2014 1339   ALT 12 03/09/2014 1339   ALKPHOS 55 03/09/2014 1339   BILITOT 0.2* 03/09/2014 1339   GFRNONAA 48* 03/28/2015 0618   GFRAA 56* 03/28/2015 0618   Assessment:  1.  Hematochezia, likely diverticular, negative tagged RBC study yesterday. 2.  Anemia, acute blood loss.  Plan:  1.  Follow clinically.  If rebleeds, would consider repeat tagged RBC study as next step in management. 2.  Follow CBCs. 3.  Patient had colonoscopy in 2011 for similar reasons, and doubt this study would show much.  However, if bleeding persists over the next few days, might need to consider at least sigmoidoscopy to assess for potentially treatable bleeding source (again unlikely). 4.  [facility] will  follow.   Freddy JakschOUTLAW,Varun Jourdan M 03/29/2015, 5:48 PM   Pager [phone] If no answer or after 5 PM call [phone]

## 2015-03-29 NOTE — Progress Notes (Signed)
PATIENT DETAILS Name: Luke Sanchez Age: 81 y.o. Sex: male Date of Birth: 04-13-1915 Admit Date: 03/26/2015 Admitting Physician Clydie Braun, MD ZOX:WRUE,AVWUJW K, MD  Brief Narrative: 81 year old male with Hx of CAD, HTN, Chronic back pain admitted for evaluation of Painless hematochezia.Thought to have a Diverticular bleed with associated acute blood loss anemia. Hospital course has been marked by persistent hematochezia-although hemtochezia has slowed down. Tagged RBC scan negative.GI following and plans are to continue supportive measures and transfuse prn.   Subjective: 3 episodes of hematochezia since yesterday.Hb downtrending to 8.5. No abd pain. Lying comfortably   Assessment/Plan: Principal Problem: Lower GI bleed: Likely diverticular bleeding. Continue supportive care. Bleeding scan neg on 12/8-delayed images negative on 12/9. Continue supportive care-will transfuse if further decrease in Hb-GI following.  Active Problems: Acute blood loss anemia: Secondary to above, follow hemoglobin and transfuse if significant drop.Hb stable  CAD S/P percutaneous coronary angioplasty: Without chest pain or shortness of breath, continue to hold all antiplatelets.  HTN (hypertension): Moderately controlled with metoprolol, resume amlodipine when able.  HLD (hyperlipidemia): Continue statin.  Low back pain: Chronic issue, continue supportive measures. Nonfocal exam, seems to be stable today.  Recent fall: No syncope, PT eval completed-SNF on discharge.  BPH: Continue Proscar and Flomax.  Disposition: Remain inpatient- SNF when hematochezia resolves  Antimicrobial agents  See below  Anti-infectives    None      DVT Prophylaxis: SCD's  Code Status: Full code  Family Communication Grand-daughter over the phone yesterday-none at bedside this am  Procedures: None  CONSULTS:  None  Time spent 25 minutes-Greater than 50% of this time was spent in  counseling, explanation of diagnosis, planning of further management, and coordination of care.  MEDICATIONS: Scheduled Meds: . citalopram  20 mg Oral Daily  . finasteride  5 mg Oral Daily  . fluticasone  2 spray Each Nare Daily  . guaiFENesin  600 mg Oral BID  . metoprolol succinate  25 mg Oral Daily  . pantoprazole (PROTONIX) IV  40 mg Intravenous Q12H  . rosuvastatin  20 mg Oral q morning - 10a  . sodium chloride  3 mL Intravenous Q12H  . tamsulosin  0.4 mg Oral Daily  . triamcinolone cream  1 application Topical BID   Continuous Infusions: . sodium chloride 40 mL/hr at 03/28/15 0633   PRN Meds:.acetaminophen, loperamide, menthol-cetylpyridinium, ondansetron **OR** ondansetron (ZOFRAN) IV    PHYSICAL EXAM: Vital signs in last 24 hours: Filed Vitals:   03/27/15 2148 03/28/15 0546 03/28/15 2206 03/29/15 0614  BP: 146/88 162/71 128/58 155/64  Pulse: 63 64 60 57  Temp: 97.9 F (36.6 C) 97.9 F (36.6 C) 98.5 F (36.9 C) 98.2 F (36.8 C)  TempSrc: Oral Oral Oral Oral  Resp: Height:      Weight:      SpO2: 98% 98% 96% 98%    Weight change:  Filed Weights   03/26/15 0141  Weight: 68.947 kg (152 lb)   Body mass index is 20.61 kg/(m^2).   Gen Exam: Awake and alert with clear speech.  Neck: Supple, No JVD.   Chest: B/L Clear.   CVS: S1 S2 Regular.  Abdomen: soft, BS +, non tender, non distended.  Extremities: no edema, lower extremities warm to touch. Neurologic: Non Focal.   Skin: No Rash.   Wounds: N/A.   Intake/Output from previous day:  Intake/Output Summary (  Last 24 hours) at 03/29/15 0951 Last data filed at 03/29/15 0500  Gross per 24 hour  Intake 2489.33 ml  Output   3050 ml  Net -560.67 ml     LAB RESULTS: CBC  Recent Labs Lab 03/26/15 0202  03/27/15 1138 03/27/15 1825 03/28/15 0618 03/28/15 1919 03/29/15 0530  WBC 8.4  --  4.8 5.4 4.6 6.2 4.3  HGB 9.5*  < > 10.0* 9.7* 9.1* 9.2* 8.5*  HCT 30.8*  < > 32.9* 30.6* 29.8* 29.9*  27.2*  PLT 179  --  144* 154 141* 147* 128*  MCV 99.4  --  96.8 95.9 97.1 96.8 96.5  MCH 30.6  --  29.4 30.4 29.6 29.8 30.1  MCHC 30.8  --  30.4 31.7 30.5 30.8 31.3  RDW 15.2  --  16.5* 16.4* 16.2* 15.7* 15.7*  LYMPHSABS 1.3  --   --   --   --   --   --   MONOABS 0.9  --   --   --   --   --   --   EOSABS 0.1  --   --   --   --   --   --   BASOSABS 0.0  --   --   --   --   --   --   < > = values in this interval not displayed.  Chemistries   Recent Labs Lab 03/26/15 0202 03/28/15 0618  NA 137 135  K 4.3 4.1  CL 105 102  CO2 21* 25  GLUCOSE 114* 88  BUN 26* 13  CREATININE 1.39* 1.20  CALCIUM 9.3 8.6*    CBG: No results for input(s): GLUCAP in the last 168 hours.  GFR Estimated Creatinine Clearance: 33.5 mL/min (by C-G formula based on Cr of 1.2).  Coagulation profile  Recent Labs Lab 03/26/15 0202  INR 1.14    Cardiac Enzymes No results for input(s): CKMB, TROPONINI, MYOGLOBIN in the last 168 hours.  Invalid input(s): CK  Invalid input(s): POCBNP No results for input(s): DDIMER in the last 72 hours. No results for input(s): HGBA1C in the last 72 hours. No results for input(s): CHOL, HDL, LDLCALC, TRIG, CHOLHDL, LDLDIRECT in the last 72 hours. No results for input(s): TSH, T4TOTAL, T3FREE, THYROIDAB in the last 72 hours.  Invalid input(s): FREET3 No results for input(s): VITAMINB12, FOLATE, FERRITIN, TIBC, IRON, RETICCTPCT in the last 72 hours. No results for input(s): LIPASE, AMYLASE in the last 72 hours.  Urine Studies No results for input(s): UHGB, CRYS in the last 72 hours.  Invalid input(s): UACOL, UAPR, USPG, UPH, UTP, UGL, UKET, UBIL, UNIT, UROB, ULEU, UEPI, UWBC, URBC, UBAC, CAST, UCOM, BILUA  MICROBIOLOGY: Recent Results (from the past 240 hour(s))  Urine culture     Status: None   Collection Time: 03/26/15  4:24 AM  Result Value Ref Range Status   Specimen Description URINE, CLEAN CATCH  Final   Special Requests NONE  Final   Culture  MULTIPLE SPECIES PRESENT, SUGGEST RECOLLECTION  Final   Report Status 03/27/2015 FINAL  Final    RADIOLOGY STUDIES/RESULTS: Dg Chest 2 View  03/26/2015  CLINICAL DATA:  81 year old male with fall and left shoulder pain EXAM: CHEST  2 VIEW COMPARISON:  Chest radiograph dated 01/03/2014 FINDINGS: Two views of the chest do not demonstrate any focal consolidation, pleural effusion, or pneumothorax. Stable cardiomegaly. Degenerative changes of the spine. No acute fracture. IMPRESSION: No active cardiopulmonary disease. Electronically Signed   By: Elgie Collard  M.D.   On: 03/26/2015 02:58   Nm Gi Blood Loss  03/28/2015  ADDENDUM REPORT: 03/28/2015 14:41 ADDENDUM: 24 hour delayed image was requested and static image was obtained (due to counts). The same distribution as seen on previous study is again identified. No findings to help localize source of GI bleeding. Electronically Signed   By: Marnee SpringJonathon  Watts M.D.   On: 03/28/2015 14:41  03/28/2015  CLINICAL DATA:  Gastrointestinal bleeding. Lower GI bleed. History diverticular bleed. EXAM: NUCLEAR MEDICINE GASTROINTESTINAL BLEEDING SCAN TECHNIQUE: Sequential abdominal images were obtained following intravenous administration of Tc-6676m labeled red blood cells. RADIOPHARMACEUTICALS:  Twenty-five mCi Tc-1476m in-vitro labeled red cells. COMPARISON:  CT 03/03/2011 FINDINGS: Dynamic planar imaging of the abdomen was acquired over 2 hours. There is no accumulation of radiotracer to suggest active bleeding into the gastrointestinal tract. The blood pool activity is noted in the aorta and IVC. The GU system is identified. IMPRESSION: No scintigraphic evidence of active gastrointestinal bleeding. Electronically Signed: By: Genevive BiStewart  Edmunds M.D. On: 03/27/2015 18:21   Dg Shoulder Left  03/26/2015  CLINICAL DATA:  Status post fall, with left shoulder pain. Initial encounter. EXAM: LEFT SHOULDER - 2+ VIEW COMPARISON:  Left shoulder radiographs performed 09/02/2010  FINDINGS: There is no evidence of fracture or dislocation. The left humeral head is seated within the glenoid fossa. The left acromioclavicular joint demonstrates mild degenerative change, with a small overlying osseous fragment. No significant soft tissue abnormalities are seen. The visualized portions of the left lung are clear. IMPRESSION: No evidence of fracture or dislocation. Chronic degenerative change noted at the left acromioclavicular joint. Electronically Signed   By: Roanna RaiderJeffery  Chang M.D.   On: 03/26/2015 02:57    Jeoffrey MassedGHIMIRE,SHANKER, MD  [facility] Pager:[phone]  If 7PM-7AM, please contact night-coverage [email or web address] Password [email or web address] 03/29/2015, 9:51 AM   LOS: 3 days

## 2015-03-29 NOTE — Progress Notes (Signed)
Provided daughter with bed offers.  Daughter to review offers and discuss options with family.  Providence CrosbyAmanda Tiasha Helvie, LCSW Clinical Social Work [phone]

## 2015-03-30 DIAGNOSIS — I2510 Atherosclerotic heart disease of native coronary artery without angina pectoris: Secondary | ICD-10-CM

## 2015-03-30 DIAGNOSIS — Z9861 Coronary angioplasty status: Secondary | ICD-10-CM

## 2015-03-30 DIAGNOSIS — I10 Essential (primary) hypertension: Secondary | ICD-10-CM

## 2015-03-30 DIAGNOSIS — N400 Enlarged prostate without lower urinary tract symptoms: Secondary | ICD-10-CM

## 2015-03-30 DIAGNOSIS — D62 Acute posthemorrhagic anemia: Secondary | ICD-10-CM

## 2015-03-30 LAB — CBC
HCT: 28.4 % — ABNORMAL LOW (ref 39.0–52.0)
HEMOGLOBIN: 9.1 g/dL — AB (ref 13.0–17.0)
MCH: 31.1 pg (ref 26.0–34.0)
MCHC: 32 g/dL (ref 30.0–36.0)
MCV: 96.9 fL (ref 78.0–100.0)
Platelets: 140 10*3/uL — ABNORMAL LOW (ref 150–400)
RBC: 2.93 MIL/uL — AB (ref 4.22–5.81)
RDW: 15.5 % (ref 11.5–15.5)
WBC: 5.3 10*3/uL (ref 4.0–10.5)

## 2015-03-30 MED ORDER — CALCIUM CARBONATE ANTACID 500 MG PO CHEW
200.0000 mg | CHEWABLE_TABLET | Freq: Two times a day (BID) | ORAL | Status: DC
  Administered 2015-03-30 – 2015-03-31 (×2): 200 mg via ORAL
  Filled 2015-03-30 (×3): qty 1

## 2015-03-30 NOTE — Progress Notes (Signed)
PATIENT DETAILS Name: Luke Sanchez Age: 81 y.o. Sex: male Date of Birth: 1915-03-28 Admit Date: 03/26/2015 Admitting Physician Clydie Braun, MD ZOX:WRUE,AVWUJW K, MD  Brief Narrative:  81 year old male with Hx of CAD, HTN, Chronic back pain admitted for evaluation of Painless hematochezia.Thought to have a Diverticular bleed with associated acute blood loss anemia. Hospital course has been marked by persistent hematochezia-although hemtochezia has slowed down. Tagged RBC scan negative.GI following and plans are to continue supportive measures and transfuse prn.   Subjective: Luke Sanchez denies any headache, no chest or abdominal pain, no more blood in stool. Feels weak all over.  Assessment/Plan:  Lower GI bleed: Likely diverticular bleeding. Continue supportive care. Bleeding scan neg on 12/8-delayed images negative on 12/9. Continue supportive care, H&H so far stable, last GI bleed appears to be at least more than 24 hours ago, if stable for another 24 hours will discharge to SNF in morning. GI following. Request GI to specify when antiplatelet medications can be resumed upon discharge.   Acute blood loss anemia: Secondary to above, no need for transfusion continue to monitor.  CAD S/P percutaneous coronary angioplasty: Without chest pain or shortness of breath, continue to hold all antiplatelets.  HTN (hypertension): Moderately controlled with metoprolol, resume amlodipine when able.  HLD (hyperlipidemia): Continue statin.  Low back pain: Chronic issue, continue supportive measures. Nonfocal exam, seems to be stable today.  Recent fall: No syncope, PT eval completed-SNF on discharge.  BPH: Continue Proscar and Flomax.    Disposition: SNF in the morning if H&H is stable and no further bleed.  Antimicrobial agents  See below  Anti-infectives    None      DVT Prophylaxis: SCD's  Code Status: Full code  Family  Communication Grand-daughter over the phone yesterday-none at bedside this am  Procedures: None  CONSULTS:  None  Time spent 25 minutes-Greater than 50% of this time was spent in counseling, explanation of diagnosis, planning of further management, and coordination of care.  MEDICATIONS: Scheduled Meds: . citalopram  20 mg Oral Daily  . finasteride  5 mg Oral Daily  . fluticasone  2 spray Each Nare Daily  . guaiFENesin  600 mg Oral BID  . metoprolol succinate  25 mg Oral Daily  . pantoprazole (PROTONIX) IV  40 mg Intravenous Q12H  . rosuvastatin  20 mg Oral q morning - 10a  . sodium chloride  3 mL Intravenous Q12H  . tamsulosin  0.4 mg Oral Daily  . triamcinolone cream  1 application Topical BID   Continuous Infusions: . sodium chloride 40 mL/hr at 03/30/15 0641   PRN Meds:.acetaminophen, loperamide, menthol-cetylpyridinium, ondansetron **OR** ondansetron (ZOFRAN) IV    PHYSICAL EXAM: Vital signs in last 24 hours: Filed Vitals:   03/29/15 0614 03/29/15 1400 03/29/15 2130 03/30/15 0512  BP: 155/64 186/75 143/70 158/66  Pulse: 57 71 61 57  Temp: 98.2 F (36.8 C) 98.6 F (37 C) 98.7 F (37.1 C) 98.4 F (36.9 C)  TempSrc: Oral Oral Oral Oral  Resp: Height:      Weight:      SpO2: 98% 98% 97% 97%    Weight change:  Filed Weights   03/26/15 0141  Weight: 68.947 kg (152 lb)   Body mass index is 20.61 kg/(m^2).   Gen Exam: Awake and alert with clear speech.  Neck: Supple, No JVD.   Chest: B/L  Clear.   CVS: S1 S2 Regular.  Abdomen: soft, BS +, non tender, non distended.  Extremities: no edema, lower extremities warm to touch. Neurologic: Non Focal.   Skin: No Rash.   Wounds: N/A.   Intake/Output from previous day:  Intake/Output Summary (Last 24 hours) at 03/30/15 0933 Last data filed at 03/30/15 0917  Gross per 24 hour  Intake   2960 ml  Output      0 ml  Net   2960 ml     LAB RESULTS: CBC  Recent Labs Lab 03/26/15 0202   03/28/15 1919 03/29/15 0530 03/29/15 1340 03/29/15 1828 03/30/15 0528  WBC 8.4  < > 6.2 4.3 5.9 5.1 5.3  HGB 9.5*  < > 9.2* 8.5* 9.4* 9.2* 9.1*  HCT 30.8*  < > 29.9* 27.2* 30.8* 29.7* 28.4*  PLT 179  < > 147* 128* 143* 140* 140*  MCV 99.4  < > 96.8 96.5 97.5 97.1 96.9  MCH 30.6  < > 29.8 30.1 29.7 30.1 31.1  MCHC 30.8  < > 30.8 31.3 30.5 31.0 32.0  RDW 15.2  < > 15.7* 15.7* 15.5 15.4 15.5  LYMPHSABS 1.3  --   --   --   --   --   --   MONOABS 0.9  --   --   --   --   --   --   EOSABS 0.1  --   --   --   --   --   --   BASOSABS 0.0  --   --   --   --   --   --   < > = values in this interval not displayed.  Chemistries   Recent Labs Lab 03/26/15 0202 03/28/15 0618  NA 137 135  K 4.3 4.1  CL 105 102  CO2 21* 25  GLUCOSE 114* 88  BUN 26* 13  CREATININE 1.39* 1.20  CALCIUM 9.3 8.6*    CBG: No results for input(s): GLUCAP in the last 168 hours.  GFR Estimated Creatinine Clearance: 33.5 mL/min (by C-G formula based on Cr of 1.2).  Coagulation profile  Recent Labs Lab 03/26/15 0202  INR 1.14    Cardiac Enzymes No results for input(s): CKMB, TROPONINI, MYOGLOBIN in the last 168 hours.  Invalid input(s): CK  Invalid input(s): POCBNP No results for input(s): DDIMER in the last 72 hours. No results for input(s): HGBA1C in the last 72 hours. No results for input(s): CHOL, HDL, LDLCALC, TRIG, CHOLHDL, LDLDIRECT in the last 72 hours. No results for input(s): TSH, T4TOTAL, T3FREE, THYROIDAB in the last 72 hours.  Invalid input(s): FREET3 No results for input(s): VITAMINB12, FOLATE, FERRITIN, TIBC, IRON, RETICCTPCT in the last 72 hours. No results for input(s): LIPASE, AMYLASE in the last 72 hours.  Urine Studies No results for input(s): UHGB, CRYS in the last 72 hours.  Invalid input(s): UACOL, UAPR, USPG, UPH, UTP, UGL, UKET, UBIL, UNIT, UROB, ULEU, UEPI, UWBC, URBC, UBAC, CAST, UCOM, BILUA  MICROBIOLOGY: Recent Results (from the past 240 hour(s))  Urine  culture     Status: None   Collection Time: 03/26/15  4:24 AM  Result Value Ref Range Status   Specimen Description URINE, CLEAN CATCH  Final   Special Requests NONE  Final   Culture MULTIPLE SPECIES PRESENT, SUGGEST RECOLLECTION  Final   Report Status 03/27/2015 FINAL  Final    RADIOLOGY STUDIES/RESULTS: Dg Chest 2 View  03/26/2015  CLINICAL DATA:  81 year old male with fall and  left shoulder pain EXAM: CHEST  2 VIEW COMPARISON:  Chest radiograph dated 01/03/2014 FINDINGS: Two views of the chest do not demonstrate any focal consolidation, pleural effusion, or pneumothorax. Stable cardiomegaly. Degenerative changes of the spine. No acute fracture. IMPRESSION: No active cardiopulmonary disease. Electronically Signed   By: Elgie Collard M.D.   On: 03/26/2015 02:58   Nm Gi Blood Loss  03/28/2015  ADDENDUM REPORT: 03/28/2015 14:41 ADDENDUM: 24 hour delayed image was requested and static image was obtained (due to counts). The same distribution as seen on previous study is again identified. No findings to help localize source of GI bleeding. Electronically Signed   By: Marnee Spring M.D.   On: 03/28/2015 14:41  03/28/2015  CLINICAL DATA:  Gastrointestinal bleeding. Lower GI bleed. History diverticular bleed. EXAM: NUCLEAR MEDICINE GASTROINTESTINAL BLEEDING SCAN TECHNIQUE: Sequential abdominal images were obtained following intravenous administration of Tc-49m labeled red blood cells. RADIOPHARMACEUTICALS:  Twenty-five mCi Tc-41m in-vitro labeled red cells. COMPARISON:  CT 03/03/2011 FINDINGS: Dynamic planar imaging of the abdomen was acquired over 2 hours. There is no accumulation of radiotracer to suggest active bleeding into the gastrointestinal tract. The blood pool activity is noted in the aorta and IVC. The GU system is identified. IMPRESSION: No scintigraphic evidence of active gastrointestinal bleeding. Electronically Signed: By: Genevive Bi M.D. On: 03/27/2015 18:21   Dg Shoulder  Left  03/26/2015  CLINICAL DATA:  Status post fall, with left shoulder pain. Initial encounter. EXAM: LEFT SHOULDER - 2+ VIEW COMPARISON:  Left shoulder radiographs performed 09/02/2010 FINDINGS: There is no evidence of fracture or dislocation. The left humeral head is seated within the glenoid fossa. The left acromioclavicular joint demonstrates mild degenerative change, with a small overlying osseous fragment. No significant soft tissue abnormalities are seen. The visualized portions of the left lung are clear. IMPRESSION: No evidence of fracture or dislocation. Chronic degenerative change noted at the left acromioclavicular joint. Electronically Signed   By: Roanna Raider M.D.   On: 03/26/2015 02:57    Leroy Sea, MD  [facility] Pager:[phone]  If 7PM-7AM, please contact night-coverage [email or web address] Password [email or web address] 03/30/2015, 9:33 AM   LOS: 4 days

## 2015-03-30 NOTE — Progress Notes (Signed)
Subjective: No bleeding today per nursing (patient doesn't recall one way or another).  Objective: Vital signs in last 24 hours: Temp:  [98.4 F (36.9 C)-98.7 F (37.1 C)] 98.4 F (36.9 C) (12/11 0512) Pulse Rate:  [57-71] 66 (12/11 1014) Resp:  [16-18] 16 (12/11 0512) BP: (143-186)/(66-75) 155/66 mmHg (12/11 1014) SpO2:  [97 %-98 %] 97 % (12/11 0512) Weight change:  Last BM Date: 03/29/15  PE: GEN:  NAD, much younger-appearing than stated age  Lab Results: CBC    Component Value Date/Time   WBC 5.3 03/30/2015 0528   RBC 2.93* 03/30/2015 0528   HGB 9.1* 03/30/2015 0528   HCT 28.4* 03/30/2015 0528   PLT 140* 03/30/2015 0528   MCV 96.9 03/30/2015 0528   MCH 31.1 03/30/2015 0528   MCHC 32.0 03/30/2015 0528   RDW 15.5 03/30/2015 0528   LYMPHSABS 1.3 03/26/2015 0202   MONOABS 0.9 03/26/2015 0202   EOSABS 0.1 03/26/2015 0202   BASOSABS 0.0 03/26/2015 0202   CMP     Component Value Date/Time   NA 135 03/28/2015 0618   K 4.1 03/28/2015 0618   CL 102 03/28/2015 0618   CO2 25 03/28/2015 0618   GLUCOSE 88 03/28/2015 0618   BUN 13 03/28/2015 0618   CREATININE 1.20 03/28/2015 0618   CALCIUM 8.6* 03/28/2015 0618   PROT 7.6 03/09/2014 1339   ALBUMIN 3.3* 03/09/2014 1339   AST 19 03/09/2014 1339   ALT 12 03/09/2014 1339   ALKPHOS 55 03/09/2014 1339   BILITOT 0.2* 03/09/2014 1339   GFRNONAA 48* 03/28/2015 0618   GFRAA 56* 03/28/2015 0618    Assessment:  1. Hematochezia, likely diverticular, negative tagged RBC study couple days ago.  No recurrent bleeding today per nursing. 2. Anemia, acute blood loss, Hgb stable.  Plan:  1.  Advance diet. 2.  Hopefully he continues to refrain from further bleeding, in which case we can hold off on any further testing. 3.  [facility] will follow.   Freddy JakschOUTLAW,[place] Straus M 03/30/2015, 11:55 AM   Pager [phone] If no answer or after 5 PM call [phone]

## 2015-03-31 DIAGNOSIS — E785 Hyperlipidemia, unspecified: Secondary | ICD-10-CM

## 2015-03-31 DIAGNOSIS — K922 Gastrointestinal hemorrhage, unspecified: Secondary | ICD-10-CM | POA: Insufficient documentation

## 2015-03-31 LAB — HEMOGLOBIN AND HEMATOCRIT, BLOOD
HEMATOCRIT: 28.1 % — AB (ref 39.0–52.0)
HEMOGLOBIN: 8.9 g/dL — AB (ref 13.0–17.0)

## 2015-03-31 MED ORDER — PANTOPRAZOLE SODIUM 40 MG PO TBEC: 40.0000 mg | DELAYED_RELEASE_TABLET | Freq: Every day | ORAL | Status: AC

## 2015-03-31 MED ORDER — OXYCODONE HCL 5 MG PO TABS: ORAL_TABLET | ORAL | Status: AC

## 2015-03-31 NOTE — Progress Notes (Signed)
[facility] Progress Note  Subjective: Patient asleep and I could not arouse and, no further rectal bleeding charted  Objective: Vital signs in last 24 hours: Temp:  [98 F (36.7 C)-99.6 F (37.6 C)] 98.7 F (37.1 C) (12/12 0552) Pulse Rate:  [66-97] 97 (12/12 0552) Resp:  [16-19] 18 (12/12 0552) BP: (123-150)/(56-70) 131/58 mmHg (12/12 0552) SpO2:  [97 %-98 %] 98 % (12/12 0552) Weight change:    PE: Deferred  Lab Results: Results for orders placed or performed during the hospital encounter of 03/26/15 (from the past 24 hour(s))  Hemoglobin and hematocrit, blood     Status: Abnormal   Collection Time: 03/31/15  6:20 AM  Result Value Ref Range   Hemoglobin 8.9 (L) 13.0 - 17.0 g/dL   HCT 78.228.1 (L) 95.639.0 - 21.352.0 %    Studies/Results: No results found.    Assessment: Lower GI bleeding, probably diverticular seems to have resolved  Plan: Will sign off for now. Okay to advance diet. Please call if suspect recurrent acute bleeding.    Jyair Kiraly C 03/31/2015, 11:11 AM  Pager [phone] If no answer or after 5 PM call [phone]

## 2015-03-31 NOTE — Clinical Social Work Note (Addendum)
Clinical Social Worker facilitated patient discharge including contacting patient family and facility to confirm patient discharge plans.  Clinical information faxed to facility and family agreeable with plan.  CS[facility]  RN to call report prior to discharge.  Clinical Social Worker will sign off for now as social work intervention is no longer needed. Please consult us again if new need arises.  Derenda FennelBashira Annie Roseboom, MSW, LCSWA [phone] 03/31/2015 12:25 PM

## 2015-03-31 NOTE — Progress Notes (Signed)
Report called to RN at [facility]. Discharged by [facility] in good condition. 

## 2015-03-31 NOTE — Clinical Social Work Note (Signed)
[facility] CSW left message with facility.   CSW remains available as needed.   Derenda FennelBashira Filiberto Wamble, MSW, LCSWA [phone] 03/31/2015 10:05 AM

## 2015-03-31 NOTE — Care Management Important Message (Signed)
Important Message  Patient Details  Name: Leanora Coverrthur Boivin MRN: [id number] Date of Birth: 05/01/1914   Medicare Important Message Given:  Yes    Oralia RudMegan P Markea Ruzich 03/31/2015, 3:37 PM

## 2015-03-31 NOTE — Discharge Instructions (Signed)
Follow with Primary MD Evlyn CourierHILL,GERALD K, MD in 7 days   Get CBC, BMP checked  by SNF MD in 24-48 hours.    Activity: As tolerated with Full fall precautions use walker/cane & assistance as needed   Disposition SNF   Diet:  Soft heart healthy diet for one week then advance to regular consistency as tolerated , with feeding assistance and aspiration precautions.  For Heart failure patients - Check your Weight same time everyday, if you gain over 2 pounds, or you develop in leg swelling, experience more shortness of breath or chest pain, call your Primary MD immediately. Follow Cardiac Low Salt Diet and 1.5 lit/day fluid restriction.   On your next visit with your primary care physician please Get Medicines reviewed and adjusted.   Please request your Prim.MD to go over all Hospital Tests and Procedure/Radiological results at the follow up, please get all Hospital records sent to your Prim MD by signing hospital release before you go home.   If you experience worsening of your admission symptoms, develop shortness of breath, life threatening emergency, suicidal or homicidal thoughts you must seek medical attention immediately by calling 911 or calling your MD immediately  if symptoms less severe.  You Must read complete instructions/literature along with all the possible adverse reactions/side effects for all the Medicines you take and that have been prescribed to you. Take any new Medicines after you have completely understood and accpet all the possible adverse reactions/side effects.   Do not drive, operating heavy machinery, perform activities at heights, swimming or participation in water activities or provide baby sitting services if your were admitted for syncope or siezures until you have seen by Primary MD or a Neurologist and advised to do so again.  Do not drive when taking Pain medications.    Do not take more than prescribed Pain, Sleep and Anxiety Medications  Special  Instructions: If you have smoked or chewed Tobacco  in the last 2 yrs please stop smoking, stop any regular Alcohol  and or any Recreational drug use.  Wear Seat belts while driving.   Please note  You were cared for by a hospitalist during your hospital stay. If you have any questions about your discharge medications or the care you received while you were in the hospital after you are discharged, you can call the unit and asked to speak with the hospitalist on call if the hospitalist that took care of you is not available. Once you are discharged, your primary care physician will handle any further medical issues. Please note that NO REFILLS for any discharge medications will be authorized once you are discharged, as it is imperative that you return to your primary care physician (or establish a relationship with a primary care physician if you do not have one) for your aftercare needs so that they can reassess your need for medications and monitor your lab values.

## 2015-03-31 NOTE — Discharge Summary (Signed)
Luke Sanchez, is a 81 y.o. male  DOB 1914-10-31  MRN [id number].  Admission date:  03/26/2015  Admitting Physician  Clydie Braunondell A Smith, MD  Discharge Date:  03/31/2015   Primary MD  Evlyn CourierHILL,GERALD K, MD  Recommendations for primary care physician for things to follow:   Check CBC in 24-48 hours. Close outpatient GI follow-up.   Admission Diagnosis  Gastrointestinal hemorrhage, unspecified gastritis, unspecified gastrointestinal hemorrhage type [K92.2]   Discharge Diagnosis  Gastrointestinal hemorrhage, unspecified gastritis, unspecified gastrointestinal hemorrhage type [K92.2]     Principal Problem:   GI bleed Active Problems:   CAD S/P percutaneous coronary angioplasty   Acute blood loss anemia   HTN (hypertension)   HLD (hyperlipidemia)   Low back pain   Fall   BPH (benign prostatic hyperplasia)   Bleeding gastrointestinal      Past Medical History  Diagnosis Date  . Hypertension   . Bronchitis   . Arthritis   . Pneumonia     hx of  . CAD (coronary artery disease)     stent RCA 2010-[name]  . UTI (lower urinary tract infection)     Past Surgical History  Procedure Laterality Date  . Coronary stent placement    . Hernia repair      x 2       HPI   :    81 year old male with Hx of CAD, HTN, Chronic back pain admitted for evaluation of Painless hematochezia.Thought to have a Diverticular bleed with associated acute blood loss anemia. Hospital course has been marked by persistent hematochezia-although hemtochezia has slowed down. Tagged RBC scan negative.GI following and plans are to continue supportive measures and transfuse prn.      Hospital Course:     Lower GI bleed: Likely diverticular bleeding. Continue supportive care. Bleeding scan neg on 12/8- and again delayed images negative on  12/9. With conservative treatment of bowel rest and supportive care his H&H remained stable he did not require any transfusions, [facility] throughout his hospital stay, no further workup at this time. [facility]. Continue soft diet for one more week then advance to regular consistency as tolerated. Avoid constipation. SNF discharge for generalized weakness and close monitoring of H&H.   Acute blood loss anemia: Secondary to above, no need for transfusion continue to monitor.  CAD S/P percutaneous coronary angioplasty: Without chest pain or shortness of breath, no acute issues home medications continued, currently not noted to be on any antiplatelet medications.  HTN (hypertension): Moderately controlled with metoprolol, resume amlodipine when able.  HLD (hyperlipidemia): Continue statin.  Low back pain: Chronic issue, continue supportive measures. Nonfocal exam, seems to be stable today.  Recent fall: No syncope, PT eval completed-SNF on discharge.  BPH: Continue Proscar and Flomax.    Discharge Condition: Fair  Follow UP  Follow-up Information    Follow up with HILL,GERALD K, MD. Schedule an appointment as soon as possible for a visit in 1 week.  Specialty:  Family Medicine   Contact information:   [address] [place] [address] [phone]       Follow up with Freddy Jaksch, MD. Schedule an appointment as soon as possible for a visit in 1 week.   Specialty:  Gastroenterology   Contact information:   [address] [place] [address] [phone]        Consults obtained [facility]  Diet and Activity recommendation: See Discharge Instructions below  Discharge Instructions           Discharge Instructions    Discharge instructions    Complete by:  As directed   Follow with Primary MD Evlyn Courier, MD in 7 days   Get CBC, BMP checked  by SNF MD in 24-48 hours.    Activity: As  tolerated with Full fall precautions use walker/cane & assistance as needed   Disposition SNF   Diet:  Soft heart healthy diet for one week then advance to regular consistency as tolerated , with feeding assistance and aspiration precautions.  For Heart failure patients - Check your Weight same time everyday, if you gain over 2 pounds, or you develop in leg swelling, experience more shortness of breath or chest pain, call your Primary MD immediately. Follow Cardiac Low Salt Diet and 1.5 lit/day fluid restriction.   On your next visit with your primary care physician please Get Medicines reviewed and adjusted.   Please request your Prim.MD to go over all Hospital Tests and Procedure/Radiological results at the follow up, please get all Hospital records sent to your Prim MD by signing hospital release before you go home.   If you experience worsening of your admission symptoms, develop shortness of breath, life threatening emergency, suicidal or homicidal thoughts you must seek medical attention immediately by calling 911 or calling your MD immediately  if symptoms less severe.  You Must read complete instructions/literature along with all the possible adverse reactions/side effects for all the Medicines you take and that have been prescribed to you. Take any new Medicines after you have completely understood and accpet all the possible adverse reactions/side effects.   Do not drive, operating heavy machinery, perform activities at heights, swimming or participation in water activities or provide baby sitting services if your were admitted for syncope or siezures until you have seen by Primary MD or a Neurologist and advised to do so again.  Do not drive when taking Pain medications.    Do not take more than prescribed Pain, Sleep and Anxiety Medications  Special Instructions: If you have smoked or chewed Tobacco  in the last 2 yrs please stop smoking, stop any regular Alcohol  and or any  Recreational drug use.  Wear Seat belts while driving.   Please note  You were cared for by a hospitalist during your hospital stay. If you have any questions about your discharge medications or the care you received while you were in the hospital after you are discharged, you can call the unit and asked to speak with the hospitalist on call if the hospitalist that took care of you is not available. Once you are discharged, your primary care physician will handle any further medical issues. Please note that NO REFILLS for any discharge medications will be authorized once you are discharged, as it is imperative that you return to your primary care physician (or establish a relationship with a primary care physician if you do not have one) for  your aftercare needs so that they can reassess your need for medications and monitor your lab values.     Increase activity slowly    Complete by:  As directed              Discharge Medications       Medication List    STOP taking these medications        loperamide 2 MG tablet  Commonly known as:  IMODIUM A-D      TAKE these medications        acetaminophen 325 MG tablet  Commonly known as:  TYLENOL  Take 2 tablets (650 mg total) by mouth every 6 (six) hours as needed for mild pain (or Fever >/= 101).     amLODipine 5 MG tablet  Commonly known as:  NORVASC  Take 2 tablets (10 mg total) by mouth daily.     ARTIFICIAL TEAR OP  Apply 1 drop to eye daily as needed. For dry eyes     citalopram 20 MG tablet  Commonly known as:  CELEXA  Take 20 mg by mouth daily.     finasteride 5 MG tablet  Commonly known as:  PROSCAR  Take 5 mg by mouth daily.     fluticasone 50 MCG/ACT nasal spray  Commonly known as:  FLONASE  Place 2 sprays into the nose daily.     loratadine 10 MG tablet  Commonly known as:  CLARITIN  Take 10 mg by mouth daily.     metoprolol succinate 25 MG 24 hr tablet  Commonly known as:  TOPROL-XL  Take 25 mg by  mouth daily.     oxyCODONE 5 MG immediate release tablet  Commonly known as:  Oxy IR/ROXICODONE  Take one tablet by mouth every 4 hours as needed for mild to moderate pain; Take two tablets by mouth every 4 hours as needed for moderate to severe pain     pantoprazole 40 MG tablet  Commonly known as:  PROTONIX  Take 1 tablet (40 mg total) by mouth daily. Switch for any other PPI at similar dose and frequency     rosuvastatin 20 MG tablet  Commonly known as:  CRESTOR  Take 20 mg by mouth every morning.     silodosin 8 MG Caps capsule  Commonly known as:  RAPAFLO  Take 1 capsule (8 mg total) by mouth daily with breakfast.     triamcinolone cream 0.1 %  Commonly known as:  KENALOG  Apply 1 application topically 2 (two) times daily. To eczema     VOLTAREN 1 % Gel  Generic drug:  diclofenac sodium  Apply 4 g topically 3 (three) times daily as needed. For knee pain        Major procedures and Radiology Reports - PLEASE review detailed and final reports for all details, in brief -       Dg Chest 2 View  03/26/2015  CLINICAL DATA:  81 year old male with fall and left shoulder pain EXAM: CHEST  2 VIEW COMPARISON:  Chest radiograph dated 01/03/2014 FINDINGS: Two views of the chest do not demonstrate any focal consolidation, pleural effusion, or pneumothorax. Stable cardiomegaly. Degenerative changes of the spine. No acute fracture. IMPRESSION: No active cardiopulmonary disease. Electronically Signed   By: Elgie Collard M.D.   On: 03/26/2015 02:58   Nm Gi Blood Loss  03/28/2015  ADDENDUM REPORT: 03/28/2015 14:41 ADDENDUM: 24 hour delayed image was requested and static image was obtained (due to counts). The same  distribution as seen on previous study is again identified. No findings to help localize source of GI bleeding. Electronically Signed   By: Marnee Spring M.D.   On: 03/28/2015 14:41  03/28/2015  CLINICAL DATA:  Gastrointestinal bleeding. Lower GI bleed. History diverticular  bleed. EXAM: NUCLEAR MEDICINE GASTROINTESTINAL BLEEDING SCAN TECHNIQUE: Sequential abdominal images were obtained following intravenous administration of Tc-2m labeled red blood cells. RADIOPHARMACEUTICALS:  Twenty-five mCi Tc-56m in-vitro labeled red cells. COMPARISON:  CT 03/03/2011 FINDINGS: Dynamic planar imaging of the abdomen was acquired over 2 hours. There is no accumulation of radiotracer to suggest active bleeding into the gastrointestinal tract. The blood pool activity is noted in the aorta and IVC. The GU system is identified. IMPRESSION: No scintigraphic evidence of active gastrointestinal bleeding. Electronically Signed: By: Genevive Bi M.D. On: 03/27/2015 18:21   Dg Shoulder Left  03/26/2015  CLINICAL DATA:  Status post fall, with left shoulder pain. Initial encounter. EXAM: LEFT SHOULDER - 2+ VIEW COMPARISON:  Left shoulder radiographs performed 09/02/2010 FINDINGS: There is no evidence of fracture or dislocation. The left humeral head is seated within the glenoid fossa. The left acromioclavicular joint demonstrates mild degenerative change, with a small overlying osseous fragment. No significant soft tissue abnormalities are seen. The visualized portions of the left lung are clear. IMPRESSION: No evidence of fracture or dislocation. Chronic degenerative change noted at the left acromioclavicular joint. Electronically Signed   By: Roanna Raider M.D.   On: 03/26/2015 02:57    Micro Results      Recent Results (from the past 240 hour(s))  Urine culture     Status: None   Collection Time: 03/26/15  4:24 AM  Result Value Ref Range Status   Specimen Description URINE, CLEAN CATCH  Final   Special Requests NONE  Final   Culture MULTIPLE SPECIES PRESENT, SUGGEST RECOLLECTION  Final   Report Status 03/27/2015 FINAL  Final   Today   Subjective    Luke Cover today has no headache,no chest abdominal pain,no new weakness tingling or numbness, feels much better .   Objective    Blood pressure 131/58, pulse 97, temperature 98.7 F (37.1 C), temperature source Oral, resp. rate 18, height 6' (1.829 m), weight 68.947 kg (152 lb), SpO2 98 %.   Intake/Output Summary (Last 24 hours) at 03/31/15 0912 Last data filed at 03/31/15 0700  Gross per 24 hour  Intake   2040 ml  Output   1750 ml  Net    290 ml    Exam Awake Alert, Oriented x 3, No new F.N deficits, Normal affect Onset.AT,PERRAL Supple Neck,No JVD, No cervical lymphadenopathy appriciated.  Symmetrical Chest wall movement, Good air movement bilaterally, CTAB RRR,No Gallops,Rubs or new Murmurs, No Parasternal Heave +ve B.Sounds, Abd Soft, Non tender, No organomegaly appriciated, No rebound -guarding or rigidity. No Cyanosis, Clubbing or edema, No new Rash or bruise   Data Review   CBC w Diff:  Lab Results  Component Value Date   WBC 5.3 03/30/2015   HGB 8.9* 03/31/2015   HCT 28.1* 03/31/2015   PLT 140* 03/30/2015   LYMPHOPCT 15 03/26/2015   MONOPCT 11 03/26/2015   EOSPCT 1 03/26/2015   BASOPCT 0 03/26/2015    CMP:  Lab Results  Component Value Date   NA 135 03/28/2015   K 4.1 03/28/2015   CL 102 03/28/2015   CO2 25 03/28/2015   BUN 13 03/28/2015   CREATININE 1.20 03/28/2015   PROT 7.6 03/09/2014   ALBUMIN 3.3* 03/09/2014  BILITOT 0.2* 03/09/2014   ALKPHOS 55 03/09/2014   AST 19 03/09/2014   ALT 12 03/09/2014  .   Total Time in preparing paper work, data evaluation and todays exam - 35 minutes  Leroy Sea M.D on 03/31/2015 at 9:12 AM  [facility]   Office  [phone]

## 2015-03-31 NOTE — Clinical Social Work Placement (Signed)
   CLINICAL SOCIAL WORK PLACEMENT  NOTE  Date:  03/31/2015  Patient Details  Name: Luke Sanchez MRN: [id number] Date of Birth: 06-10-1914  Clinical Social Work is seeking post-discharge placement for this patient at the Skilled  Nursing Facility level of care (*CSW will initial, date and re-position this form in  chart as items are completed):  Yes   Patient/family provided with [place] Clinical Social Work Department's list of facilities offering this level of care within the geographic area requested by the patient (or if unable, by the patient's family).  Yes   Patient/family informed of their freedom to choose among providers that offer the needed level of care, that participate in Medicare, Medicaid or managed care program needed by the patient, have an available bed and are willing to accept the patient.  Yes   Patient/family informed of 's ownership interest in [facility] and [facility], as well as of the fact that they are under no obligation to receive care at these facilities.  PASRR submitted to EDS on       PASRR number received on 03/28/15     Existing PASRR number confirmed on 03/28/15     FL2 transmitted to all facilities in geographic area requested by pt/family on 03/28/15     FL2 transmitted to all facilities within larger geographic area on       Patient informed that his/her managed care company has contracts with or will negotiate with certain facilities, including the following:        Yes   Patient/family informed of bed offers received.  Patient chooses bed at  [facility] )     Physician recommends and patient chooses bed at      Patient to be transferred to  [facility] ) on 03/31/15.  Patient to be transferred to facility by  [name])     Patient family notified on 03/31/15 of transfer.  Name of family member notified:   (Pt's granddaughter, Ginny Forthashana)     PHYSICIAN Please sign FL2     Additional Comment:     _______________________________________________ Vaughan BrownerNixon, Meliss Fleek A, LCSW 03/31/2015, 12:24 PM

## 2016-01-09 ENCOUNTER — Encounter (HOSPITAL_COMMUNITY): Payer: Self-pay

## 2016-01-09 ENCOUNTER — Inpatient Hospital Stay (HOSPITAL_COMMUNITY)
Admission: EM | Admit: 2016-01-09 | Discharge: 2016-01-18 | DRG: 871 | Disposition: E | Attending: Internal Medicine | Admitting: Internal Medicine

## 2016-01-09 ENCOUNTER — Emergency Department (HOSPITAL_COMMUNITY)

## 2016-01-09 DIAGNOSIS — N400 Enlarged prostate without lower urinary tract symptoms: Secondary | ICD-10-CM | POA: Diagnosis present

## 2016-01-09 DIAGNOSIS — Z9861 Coronary angioplasty status: Secondary | ICD-10-CM

## 2016-01-09 DIAGNOSIS — R64 Cachexia: Secondary | ICD-10-CM | POA: Diagnosis present

## 2016-01-09 DIAGNOSIS — I350 Nonrheumatic aortic (valve) stenosis: Secondary | ICD-10-CM | POA: Diagnosis present

## 2016-01-09 DIAGNOSIS — Z955 Presence of coronary angioplasty implant and graft: Secondary | ICD-10-CM | POA: Diagnosis not present

## 2016-01-09 DIAGNOSIS — R6521 Severe sepsis with septic shock: Secondary | ICD-10-CM | POA: Diagnosis not present

## 2016-01-09 DIAGNOSIS — E872 Acidosis, unspecified: Secondary | ICD-10-CM | POA: Diagnosis present

## 2016-01-09 DIAGNOSIS — A419 Sepsis, unspecified organism: Principal | ICD-10-CM | POA: Diagnosis present

## 2016-01-09 DIAGNOSIS — R197 Diarrhea, unspecified: Secondary | ICD-10-CM | POA: Diagnosis present

## 2016-01-09 DIAGNOSIS — I2510 Atherosclerotic heart disease of native coronary artery without angina pectoris: Secondary | ICD-10-CM | POA: Diagnosis present

## 2016-01-09 DIAGNOSIS — Z6820 Body mass index (BMI) 20.0-20.9, adult: Secondary | ICD-10-CM

## 2016-01-09 DIAGNOSIS — Z87891 Personal history of nicotine dependence: Secondary | ICD-10-CM

## 2016-01-09 DIAGNOSIS — Z66 Do not resuscitate: Secondary | ICD-10-CM | POA: Diagnosis present

## 2016-01-09 DIAGNOSIS — J189 Pneumonia, unspecified organism: Secondary | ICD-10-CM | POA: Diagnosis present

## 2016-01-09 DIAGNOSIS — Z7951 Long term (current) use of inhaled steroids: Secondary | ICD-10-CM

## 2016-01-09 DIAGNOSIS — Z79899 Other long term (current) drug therapy: Secondary | ICD-10-CM | POA: Diagnosis not present

## 2016-01-09 DIAGNOSIS — Z87440 Personal history of urinary (tract) infections: Secondary | ICD-10-CM

## 2016-01-09 DIAGNOSIS — E785 Hyperlipidemia, unspecified: Secondary | ICD-10-CM | POA: Diagnosis present

## 2016-01-09 DIAGNOSIS — N179 Acute kidney failure, unspecified: Secondary | ICD-10-CM | POA: Diagnosis present

## 2016-01-09 DIAGNOSIS — I10 Essential (primary) hypertension: Secondary | ICD-10-CM | POA: Diagnosis present

## 2016-01-09 DIAGNOSIS — R112 Nausea with vomiting, unspecified: Secondary | ICD-10-CM | POA: Diagnosis present

## 2016-01-09 DIAGNOSIS — R1110 Vomiting, unspecified: Secondary | ICD-10-CM | POA: Diagnosis present

## 2016-01-09 LAB — COMPREHENSIVE METABOLIC PANEL
ALBUMIN: 2.4 g/dL — AB (ref 3.5–5.0)
ALT: 9 U/L — AB (ref 17–63)
AST: 38 U/L (ref 15–41)
Alkaline Phosphatase: 70 U/L (ref 38–126)
Anion gap: 16 — ABNORMAL HIGH (ref 5–15)
BUN: 22 mg/dL — AB (ref 6–20)
CHLORIDE: 109 mmol/L (ref 101–111)
CO2: 18 mmol/L — AB (ref 22–32)
Calcium: 8.7 mg/dL — ABNORMAL LOW (ref 8.9–10.3)
Creatinine, Ser: 2.23 mg/dL — ABNORMAL HIGH (ref 0.61–1.24)
GFR calc Af Amer: 26 mL/min — ABNORMAL LOW (ref >60)
GFR, EST NON AFRICAN AMERICAN: 23 mL/min — AB (ref >60)
GLUCOSE: 98 mg/dL (ref 65–99)
POTASSIUM: 3.9 mmol/L (ref 3.5–5.1)
SODIUM: 143 mmol/L (ref 135–145)
Total Bilirubin: 0.7 mg/dL (ref 0.3–1.2)
Total Protein: 6.7 g/dL (ref 6.5–8.1)

## 2016-01-09 LAB — I-STAT ARTERIAL BLOOD GAS, ED
Acid-base deficit: 9 mmol/L — ABNORMAL HIGH (ref 0.0–2.0)
BICARBONATE: 17 mmol/L — AB (ref 20.0–28.0)
O2 Saturation: 99 %
PCO2 ART: 37.6 mmHg (ref 32.0–48.0)
TCO2: 18 mmol/L (ref 0–100)
pH, Arterial: 7.265 — ABNORMAL LOW (ref 7.350–7.450)
pO2, Arterial: 144 mmHg — ABNORMAL HIGH (ref 83.0–108.0)

## 2016-01-09 LAB — CBC WITH DIFFERENTIAL/PLATELET
Basophils Absolute: 0 10*3/uL (ref 0.0–0.1)
Basophils Relative: 0 %
EOS PCT: 0 %
Eosinophils Absolute: 0 10*3/uL (ref 0.0–0.7)
HCT: 33.1 % — ABNORMAL LOW (ref 39.0–52.0)
Hemoglobin: 9.1 g/dL — ABNORMAL LOW (ref 13.0–17.0)
LYMPHS ABS: 1.5 10*3/uL (ref 0.7–4.0)
LYMPHS PCT: 11 %
MCH: 26.4 pg (ref 26.0–34.0)
MCHC: 27.5 g/dL — AB (ref 30.0–36.0)
MCV: 95.9 fL (ref 78.0–100.0)
MONOS PCT: 2 %
Monocytes Absolute: 0.3 10*3/uL (ref 0.1–1.0)
Neutro Abs: 11.1 10*3/uL — ABNORMAL HIGH (ref 1.7–7.7)
Neutrophils Relative %: 87 %
PLATELETS: 280 10*3/uL (ref 150–400)
RBC: 3.45 MIL/uL — AB (ref 4.22–5.81)
RDW: 16.3 % — ABNORMAL HIGH (ref 11.5–15.5)
WBC: 12.9 10*3/uL — AB (ref 4.0–10.5)

## 2016-01-09 LAB — APTT: aPTT: 66 seconds — ABNORMAL HIGH (ref 24–36)

## 2016-01-09 LAB — I-STAT CG4 LACTIC ACID, ED
LACTIC ACID, VENOUS: 9.74 mmol/L — AB (ref 0.5–1.9)
LACTIC ACID, VENOUS: 6.47 mmol/L — AB (ref 0.5–1.9)

## 2016-01-09 LAB — PHOSPHORUS: Phosphorus: 2.9 mg/dL (ref 2.5–4.6)

## 2016-01-09 LAB — PROTIME-INR
INR: 2.27
PROTHROMBIN TIME: 25.4 s — AB (ref 11.4–15.2)

## 2016-01-09 LAB — MAGNESIUM: Magnesium: 1.4 mg/dL — ABNORMAL LOW (ref 1.7–2.4)

## 2016-01-09 LAB — LACTIC ACID, PLASMA: LACTIC ACID, VENOUS: 5.6 mmol/L — AB (ref 0.5–1.9)

## 2016-01-09 MED ORDER — VANCOMYCIN HCL IN DEXTROSE 1-5 GM/200ML-% IV SOLN: 1000.0000 mg | INTRAVENOUS | Status: DC

## 2016-01-09 MED ORDER — SODIUM CHLORIDE 0.9 % IV BOLUS (SEPSIS)
1000.0000 mL | Freq: Once | INTRAVENOUS | Status: AC
  Administered 2016-01-09: 1000 mL via INTRAVENOUS

## 2016-01-09 MED ORDER — SODIUM CHLORIDE 0.9 % IV SOLN
Freq: Once | INTRAVENOUS | Status: AC
  Administered 2016-01-09: 1000 mL/h via INTRAVENOUS

## 2016-01-09 MED ORDER — MAGNESIUM SULFATE 4 GM/100ML IV SOLN: 4.0000 g | Freq: Once | INTRAVENOUS | Status: DC

## 2016-01-09 MED ORDER — LACTATED RINGERS IV BOLUS (SEPSIS)
1000.0000 mL | Freq: Once | INTRAVENOUS | Status: AC
  Administered 2016-01-09: 1000 mL via INTRAVENOUS

## 2016-01-09 MED ORDER — SODIUM CHLORIDE 0.9 % IV BOLUS (SEPSIS): 1000.0000 mL | Freq: Once | INTRAVENOUS | Status: DC

## 2016-01-09 MED ORDER — AZTREONAM 1 G IJ SOLR
500.0000 mg | Freq: Three times a day (TID) | INTRAMUSCULAR | Status: DC
  Filled 2016-01-09 (×3): qty 0.5

## 2016-01-09 MED ORDER — DEXTROSE 5 % IV SOLN
2.0000 g | Freq: Once | INTRAVENOUS | Status: AC
  Administered 2016-01-09: 2 g via INTRAVENOUS
  Filled 2016-01-09: qty 2

## 2016-01-09 MED ORDER — VANCOMYCIN HCL IN DEXTROSE 1-5 GM/200ML-% IV SOLN
1000.0000 mg | Freq: Once | INTRAVENOUS | Status: AC
  Administered 2016-01-09: 1000 mg via INTRAVENOUS
  Filled 2016-01-09: qty 200

## 2016-01-09 MED ORDER — LEVOFLOXACIN IN D5W 750 MG/150ML IV SOLN
750.0000 mg | Freq: Once | INTRAVENOUS | Status: AC
  Administered 2016-01-09: 750 mg via INTRAVENOUS
  Filled 2016-01-09: qty 150

## 2016-01-09 MED ORDER — LACTATED RINGERS IV SOLN
INTRAVENOUS | Status: DC
  Administered 2016-01-09: 17:00:00 via INTRAVENOUS

## 2016-01-09 MED ORDER — LEVOFLOXACIN IN D5W 500 MG/100ML IV SOLN: 500.0000 mg | INTRAVENOUS | Status: DC

## 2016-01-14 LAB — CULTURE, BLOOD (ROUTINE X 2)
CULTURE: NO GROWTH
Culture: NO GROWTH

## 2016-01-18 NOTE — ED Provider Notes (Signed)
Patient's care signed out to follow lab results and reassess. Likely plan for critical care admission. Update critical care on progress. Plan to follow-up repeat lactate and ABG.  Patient's blood pressure 70s. Patient's blood pressure did improve with fluid boluses. Repeat fluid boluses ordered. Antibiotics given. ABG pending.  On exam patient is more alert since arrival per family report. Patient has dry mucous membranes, tachycardia, tachypnea. Nonrebreather mask in place. CRITICAL CARE Performed by: Enid SkeensZAVITZ, Quana Chamberlain M Total critical care time: 75 minutes  Critical care time was exclusive of separately billable procedures and treating other patients.  Critical care was necessary to treat or prevent imminent or life-threatening deterioration.  Critical care was time spent personally by me on the following activities: development of treatment plan with patient and/or surrogate as well as nursing, discussions with consultants, evaluation of patient's response to treatment, examination of patient, obtaining history from patient or surrogate, ordering and performing treatments and interventions, ordering and review of laboratory studies, ordering and review of radiographic studies, pulse oximetry and re-evaluation of patient's condition.  Labs Reviewed  COMPREHENSIVE METABOLIC PANEL - Abnormal; Notable for the following:       Result Value   CO2 18 (*)    BUN 22 (*)    Creatinine, Ser 2.23 (*)    Calcium 8.7 (*)    Albumin 2.4 (*)    ALT 9 (*)    GFR calc non Af Amer 23 (*)    GFR calc Af Amer 26 (*)    Anion gap 16 (*)    All other components within normal limits  CBC WITH DIFFERENTIAL/PLATELET - Abnormal; Notable for the following:    WBC 12.9 (*)    RBC 3.45 (*)    Hemoglobin 9.1 (*)    HCT 33.1 (*)    MCHC 27.5 (*)    RDW 16.3 (*)    Neutro Abs 11.1 (*)    All other components within normal limits  I-STAT CG4 LACTIC ACID, ED - Abnormal; Notable for the following:    Lactic Acid,  Venous 9.74 (*)    All other components within normal limits  CULTURE, BLOOD (ROUTINE X 2)  CULTURE, BLOOD (ROUTINE X 2)  URINE CULTURE  URINALYSIS, ROUTINE W REFLEX MICROSCOPIC (NOT AT [facility])  BLOOD GAS, ARTERIAL    Ezabella Teska Enid SkeensM    Marleta Lapierre, MD 01/13/16 1553

## 2016-01-18 NOTE — Progress Notes (Signed)
Pharmacy Antibiotic Note  Luke Sanchez is a 82 y.o. male admitted on 2015-12-16 with emesis. His lactic acid is elevated at 9.7, as well as his SCr. His baseline Scr appears to be around 1.3, and is currently 2.2,  eCrCl < 20 ml/min. Will start empiric antibiotics for sepsis.   Plan: -Vancomycin 1 g IV x1 then 1 g IV q48h -Aztreonam 500 mg IV q8h -Levaquin 500 mg IV q48h -Monitor renal fx, cultures, duration of therapy -May have to dose vancomycin off of random levels  Height: 5\' 10"  (177.8 cm) Weight: 140 lb (63.5 kg) IBW/kg (Calculated) : 73  Temp (24hrs), Avg:97.5 F (36.4 C), Min:97.5 F (36.4 C), Max:97.5 F (36.4 C)   Recent Labs Lab 06-07-2015 1436  LATICACIDVEN 9.74*    CrCl cannot be calculated (Patient's most recent lab result is older than the maximum 21 days allowed.).     Antimicrobials this admission: 9/22 vancomycin > 9/22 levaquin > 9/22 aztreonam >  Dose adjustments this admission: NA  Microbiology results: 9/22 blood cx: 9/22 urine cx:  Thank you for allowing pharmacy to be a part of this patient's care.  Baldemar FridayMasters, Reda Citron M 2015-12-16 2:44 PM

## 2016-01-18 NOTE — ED Notes (Signed)
Attempted to obtain urine specimen via drainage bag. Patient has had no output since 1615 when condom catheter was placed

## 2016-01-18 NOTE — ED Notes (Signed)
Pt coughs thick yellow phlegm to tissue.

## 2016-01-18 NOTE — ED Triage Notes (Addendum)
Pt arrives EMS with c/o emesis over last 2 days.BP 65 on arrival. Dr. Bebe ShaggyWickline aware.

## 2016-01-18 NOTE — ED Notes (Signed)
Pt arrives with 2 days hx of emesis at home. Irregular resp pattern with increased wortk of breathing and diminished luyng sound. No radial pulse. Dr. Bebe ShaggyWickline aware and at bedside.

## 2016-01-18 NOTE — ED Notes (Signed)
Chaplain paged  

## 2016-01-18 NOTE — H&P (Addendum)
History and Physical    Luke Sanchez ZOX:[id number] DOB: [birth year] DOA: 2016/02/01  PCP: Evlyn Courier, MD   Patient coming from: Home.  Chief Complaint: Vomiting.  HPI: Luke Sanchez is a 82 y.o. male with medical history significant of osteoarthritis, bronchitis, pneumonia, CAD/Stent placement, hypertension, pneumonia, UTI currently on hospice who was brought to the emergency department via EMS due to nausea and vomiting since yesterday.  Per patient's granddaughter and Luke Sanchez, the patient as had countless episodes of emesis yesterday. He also had 5 episodes of emesis today, but she does not know much of the story, since her mother, the patient's daughter was the one staying with him for the last few days. His systolic blood pressure was 65 mmHg on arrival to the hospital.  ED Course: Initial lactic acid was 9.74 mmol/L, WBC 12.9, hemoglobin 9.1 g/dL, his creatinine went up from 1.20 in December to 2.23 today mg/dL. The patient received multiple fluid boluses of normal saline and lactated Ringer without significant success in bringing getting up his blood pressure. Also vancomycin, Levaquin and aztreonam per pharmacy.  The clinical status of the patient was discussed in extensive detail with the patient's granddaughter. She wanted him to remain DNR/DNI, but receiving full medical treatment without any heroic measures orprocedures. ICU was consulted by Dr. Jodi Mourning, they recommended a stepdown admission as no procedures were going to be needed.  Review of Systems: Unable to review due to the patient's acuity.   Past Medical History:  Diagnosis Date  . Arthritis   . Bronchitis   . CAD (coronary artery disease)    stent RCA 2010-[name]  . Hypertension   . Pneumonia    hx of  . UTI (lower urinary tract infection)     Past Surgical History:  Procedure Laterality Date  . CORONARY STENT PLACEMENT    . HERNIA REPAIR     x 2     reports that he has never smoked. He has  quit using smokeless tobacco. His smokeless tobacco use included Snuff and Chew. He reports that he does not drink alcohol or use drugs.  Allergies  Allergen Reactions  . Penicillins Anaphylaxis    Has patient had a PCN reaction causing immediate rash, facial/tongue/throat swelling, SOB or lightheadedness with hypotension: Yes Has patient had a PCN reaction causing severe rash involving mucus membranes or skin necrosis: Unknown Has patient had a PCN reaction that required hospitalization: Unknown Has patient had a PCN Noreaction occurring within the last 10 years: No If all of the above answers are "NO", then may proceed with Cephalosporin use.  . Tape Other (See Comments)    HAS SKIN ISSUES; NO TAPE, PLEASE!!!    Family History  Problem Relation Age of Onset  . Heart attack Brother   . Heart attack Brother   . Cancer Brother      Prior to Admission medications   Medication Sig Start Date End Date Taking? Authorizing Provider  amLODipine (NORVASC) 5 MG tablet Take 2 tablets (10 mg total) by mouth daily. Patient taking differently: Take 5 mg by mouth daily.  06/19/12  Yes Ripudeep Jenna Luo, MD  ARTIFICIAL TEAR OP Place 1 drop into both eyes 3 (three) times daily as needed (for dry eyes). For dry eyes   Yes Historical Provider, MD  citalopram (CELEXA) 10 MG tablet Take 10 mg by mouth daily. 01/02/16  Yes Historical Provider, MD  diclofenac sodium (VOLTAREN) 1 % GEL Apply 4 g topically 3 (three) times daily as needed.  For knee pain   Yes Historical Provider, MD  finasteride (PROSCAR) 5 MG tablet Take 5 mg by mouth daily.    Yes Historical Provider, MD  fluticasone (FLONASE) 50 MCG/ACT nasal spray Place 2 sprays into the nose daily. Patient taking differently: Place 2 sprays into both nostrils daily.  02/11/13  Yes Garnetta BuddyEdward V Williamson, MD  HYDROcodone-acetaminophen (NORCO/VICODIN) 5-325 MG tablet Take 1 tablet by mouth every 6 (six) hours as needed for pain. for pain 12/15/15  Yes Historical  Provider, MD  loratadine (CLARITIN) 10 MG tablet Take 10 mg by mouth daily.   Yes Historical Provider, MD  metoprolol succinate (TOPROL-XL) 25 MG 24 hr tablet Take 25 mg by mouth daily.   Yes Historical Provider, MD  nystatin (MYCOSTATIN) 100000 UNIT/ML suspension Take 10 mLs by mouth 4 (four) times daily as needed. For mouth sores (SWISH AND SWALLOW) 12/01/15  Yes Historical Provider, MD  polyethylene glycol powder (GLYCOLAX/MIRALAX) powder Take 17 g by mouth See admin instructions. One to two times a day as needed for constipation 10/31/15  Yes Historical Provider, MD  rosuvastatin (CRESTOR) 20 MG tablet Take 20 mg by mouth every morning.   Yes Historical Provider, MD  silodosin (RAPAFLO) 8 MG CAPS capsule Take 1 capsule (8 mg total) by mouth daily with breakfast. 03/09/14  Yes Gerhard Munchobert Lockwood, MD  sucralfate (CARAFATE) 1 GM/10ML suspension Take 1 g by mouth 4 (four) times daily.   Yes Historical Provider, MD  Sulfacetamide in Bakuchiol (SODIUM SULFACETAMIDE WASH EX) Apply 1 application topically See admin instructions. WITH EACH BATH   Yes Historical Provider, MD  triamcinolone (KENALOG) 0.147 MG/GM topical spray Apply 1-2 sprays topically 2 (two) times daily.    Yes Historical Provider, MD  triamcinolone cream (KENALOG) 0.1 % Apply 1 application topically 2 (two) times daily. To eczema   Yes Historical Provider, MD  acetaminophen (TYLENOL) 325 MG tablet Take 2 tablets (650 mg total) by mouth every 6 (six) hours as needed for mild pain (or Fever >/= 101). 01/07/14   [name], MD  oxyCODONE (OXY IR/ROXICODONE) 5 MG immediate release tablet Take one tablet by mouth every 4 hours as needed for mild to moderate pain; Take two tablets by mouth every 4 hours as needed for moderate to severe pain 03/31/15   Leroy SeaPrashant K Singh, MD  pantoprazole (PROTONIX) 40 MG tablet Take 1 tablet (40 mg total) by mouth daily. Switch for any other PPI at similar dose and frequency 03/31/15   Leroy SeaPrashant K Singh, MD     Physical Exam:  Constitutional: Cachexia, looks acutely ill. Vitals:   Nov 25, 2015 1815 Nov 25, 2015 1830 Nov 25, 2015 1900 Nov 25, 2015 1915  BP: (!) 72/54 (!) 77/57 (!) 89/56 (!) 73/49  Pulse:      Resp: 22 (!) 27 20 (!) 33  Temp:      TempSrc:      SpO2:      Weight:      Height:       Eyes: PERRL, lids and conjunctivae normal ENMT: Non-rebreather mask on. Mucous membranes are dry.  Neck: normal, supple, no masses, no thyromegaly Respiratory: Decreased breath sounds bilaterally. Decreased respiratory effort. No accessory muscle use.  Cardiovascular: Tachycardic at 112 bpm, no murmurs / rubs / gallops. No extremity edema. 2+ pedal pulses. No carotid bruits.  Abdomen: Firm, positive epigastric tenderness, no guarding/rebound masses palpated. No hepatosplenomegaly. Bowel sounds positive.  Musculoskeletal: no clubbing / cyanosis. Good ROM, no contractures. Decreased muscle tone.  Skin: no rashes, lesions, ulcers on limited  skin exam Neurologic: Moves extremities, generalized weakness. Unable to fully evaluate. Psychiatric: Lethargic. Unable to fully evaluate.   Labs on Admission: I have personally reviewed following labs and imaging studies  CBC:  Recent Labs Lab 02/07/16 1349  WBC 12.9*  NEUTROABS 11.1*  HGB 9.1*  HCT 33.1*  MCV 95.9  PLT 280   Basic Metabolic Panel:  Recent Labs Lab 07-Feb-2016 1349  NA 143  K 3.9  CL 109  CO2 18*  GLUCOSE 98  BUN 22*  CREATININE 2.23*  CALCIUM 8.7*   GFR: Estimated Creatinine Clearance: 16.2 mL/min (by C-G formula based on SCr of 2.23 mg/dL (H)). Liver Function Tests:  Recent Labs Lab 02-07-2016 1349  AST 38  ALT 9*  ALKPHOS 70  BILITOT 0.7  PROT 6.7  ALBUMIN 2.4*   No results for input(s): LIPASE, AMYLASE in the last 168 hours. No results for input(s): AMMONIA in the last 168 hours. Coagulation Profile: No results for input(s): INR, PROTIME in the last 168 hours. Cardiac Enzymes: No results for input(s): CKTOTAL, CKMB,  CKMBINDEX, TROPONINI in the last 168 hours. BNP (last 3 results) No results for input(s): PROBNP in the last 8760 hours. HbA1C: No results for input(s): HGBA1C in the last 72 hours. CBG: No results for input(s): GLUCAP in the last 168 hours. Lipid Profile: No results for input(s): CHOL, HDL, LDLCALC, TRIG, CHOLHDL, LDLDIRECT in the last 72 hours. Thyroid Function Tests: No results for input(s): TSH, T4TOTAL, FREET4, T3FREE, THYROIDAB in the last 72 hours. Anemia Panel: No results for input(s): VITAMINB12, FOLATE, FERRITIN, TIBC, IRON, RETICCTPCT in the last 72 hours. Urine analysis:    Component Value Date/Time   COLORURINE YELLOW 03/26/2015 0424   APPEARANCEUR CLEAR 03/26/2015 0424   LABSPEC 1.020 03/26/2015 0424   PHURINE 5.5 03/26/2015 0424   GLUCOSEU NEGATIVE 03/26/2015 0424   HGBUR NEGATIVE 03/26/2015 0424   BILIRUBINUR NEGATIVE 03/26/2015 0424   KETONESUR NEGATIVE 03/26/2015 0424   PROTEINUR 30 (A) 03/26/2015 0424   UROBILINOGEN 0.2 03/09/2014 1329   NITRITE NEGATIVE 03/26/2015 0424   LEUKOCYTESUR NEGATIVE 03/26/2015 0424    Radiological Exams on Admission: Dg Chest Port 1 View  Result Date: 2016-02-07 CLINICAL DATA:  Weakness, difficulty breathing, bronchitis EXAM: PORTABLE CHEST 1 VIEW COMPARISON:  03/26/2015 FINDINGS: Cardiomediastinal silhouette is stable. Mild elevation of the right hemidiaphragm again noted. There is patchy atelectasis or infiltrate in lingula and left perihilar. Follow-up to resolution is recommended. IMPRESSION: Patchy atelectasis or infiltrate in lingula and left perihilar. Follow-up to resolution is recommended. Electronically Signed   By: Natasha Mead M.D.   On: 07-Feb-2016 14:33    EKG: Independently reviewed. Vent. rate 114 BPM PR interval * ms QRS duration 102 ms QT/QTc 358/493 ms P-R-T axes 63 -65 109 Sinus tachycardia Left anterior fascicular block LVH with secondary repolarization abnormality Anterior Q waves, possibly due to LVH ST  depression, probably rate related Baseline wander in lead(s) I III aVL  Assessment/Plan Principal Problem:     Septic shock due to undetermined organism [facility]) Admitted to the stepdown unit/inpatient. Unfortunately, as earlier mentioned, aggressive IV fluid resuscitation and IV antibiotic therapy were not effective to reverse shock. However, the patient briefly felt better and was able to communicate his wishes for care to his granddaughter. After discussing with his granddaughter, they did not want pressors, endotracheal intubation with mechanical ventilation, central or arterial line placement. He was pronounced dead around Sep 23, 2128.  Active Problems:   CAP (community acquired pneumonia) Received supplemental oxygen, IV fluid boluses  and IV antibiotics.    Lactic acidosis Received multiple IV fluid boluses and IV antibiotics with partial improvement.    AKI (acute kidney injury) (HCC) Received multiple IV fluid boluses.    Nausea and vomiting   Diarrhea Received aggressive rehydration.     Aortic valve stenosis   CAD S/P percutaneous coronary angioplasty   HTN (hypertension)   HLD (hyperlipidemia)   BPH (benign prostatic hyperplasia) Treatment for these was not started as the patient passed away before he was able to continue therapy for above diagnoses.     DVT prophylaxis: Heparin SQ. Code Status: DNR/DNI. Family Communication: HPI was provided by his granddaughter and POA. Rudy Jew Grandaughter [phone]  Disposition Plan: Admit for septic shock and pneumonia treatment. Consults called: Critical care recommended SDU admission. Admission status: Inpatient/SDU.   Bobette Mo MD [facility] Pager [phone].  If 7PM-7AM, please contact night-coverage [email or web address] Password [facility]  05-Feb-2016, 7:49 PM

## 2016-01-18 NOTE — ED Notes (Signed)
Pt placed on nrb mask at 12 l due to sat [id number] 50%. Dr. Bebe ShaggyWickline aware.

## 2016-01-18 NOTE — ED Provider Notes (Signed)
[facility]-EMERGENCY DEPT Provider Note   CSN: [id number] Arrival date & time: 01-23-16  1342     History   Chief Complaint Chief Complaint  Patient presents with  . Emesis   LEVEL 5 CAVEAT DUE TO ACUITY OF CONDITION  HPI Kate Sweetman is a 82 y.o. male.  The history is provided by the EMS personnel. The history is limited by the condition of the patient.  Patient is a 82 year old male with h/o CAD, HTN, currently on hospice/DNR who presents for worsened vomiting over past 24 hours No other details are known at this time  Past Medical History:  Diagnosis Date  . Arthritis   . Bronchitis   . CAD (coronary artery disease)    stent RCA 2010-[name]  . Hypertension   . Pneumonia    hx of  . UTI (lower urinary tract infection)     Patient Active Problem List   Diagnosis Date Noted  . Bleeding gastrointestinal   . GI bleed 03/26/2015  . Fall 03/26/2015  . BPH (benign prostatic hyperplasia) 03/26/2015  . Intractable low back pain 01/04/2014  . HLD (hyperlipidemia) 01/04/2014  . Normocytic normochromic anemia 01/04/2014  . Low back pain 01/04/2014  . HTN (hypertension) 06/17/2012  . Overdose 06/17/2012  . Aortic valve stenosis 03/03/2011  . CAD S/P percutaneous coronary angioplasty 03/03/2011  . GIB (gastrointestinal bleeding) 03/03/2011  . Abdominal pain, left lower quadrant 03/03/2011  . Acute blood loss anemia 03/03/2011  . Diverticulitis of colon with bleeding 03/03/2011    Past Surgical History:  Procedure Laterality Date  . CORONARY STENT PLACEMENT    . HERNIA REPAIR     x 2       Home Medications    Prior to Admission medications   Medication Sig Start Date End Date Taking? Authorizing Provider  acetaminophen (TYLENOL) 325 MG tablet Take 2 tablets (650 mg total) by mouth every 6 (six) hours as needed for mild pain (or Fever >/= 101). 01/07/14   Alison Murray, MD  amLODipine (NORVASC) 5 MG tablet Take 2 tablets (10 mg total) by mouth daily. Patient taking  differently: Take 5 mg by mouth daily.  06/19/12   Ripudeep Jenna Luo, MD  ARTIFICIAL TEAR OP Apply 1 drop to eye daily as needed. For dry eyes    Historical Provider, MD  citalopram (CELEXA) 20 MG tablet Take 20 mg by mouth daily.    Historical Provider, MD  diclofenac sodium (VOLTAREN) 1 % GEL Apply 4 g topically 3 (three) times daily as needed. For knee pain    Historical Provider, MD  finasteride (PROSCAR) 5 MG tablet Take 5 mg by mouth daily.     Historical Provider, MD  fluticasone (FLONASE) 50 MCG/ACT nasal spray Place 2 sprays into the nose daily. 02/11/13   Garnetta Buddy, MD  loratadine (CLARITIN) 10 MG tablet Take 10 mg by mouth daily.    Historical Provider, MD  metoprolol succinate (TOPROL-XL) 25 MG 24 hr tablet Take 25 mg by mouth daily.    Historical Provider, MD  oxyCODONE (OXY IR/ROXICODONE) 5 MG immediate release tablet Take one tablet by mouth every 4 hours as needed for mild to moderate pain; Take two tablets by mouth every 4 hours as needed for moderate to severe pain 03/31/15   Leroy Sea, MD  pantoprazole (PROTONIX) 40 MG tablet Take 1 tablet (40 mg total) by mouth daily. Switch for any other PPI at similar dose and frequency 03/31/15   Leroy Sea,  MD  rosuvastatin (CRESTOR) 20 MG tablet Take 20 mg by mouth every morning.    Historical Provider, MD  silodosin (RAPAFLO) 8 MG CAPS capsule Take 1 capsule (8 mg total) by mouth daily with breakfast. 03/09/14   [name], MD  triamcinolone cream (KENALOG) 0.1 % Apply 1 application topically 2 (two) times daily. To eczema    Historical Provider, MD    Family History No family history on file.  Social History Social History  Substance Use Topics  . Smoking status: Never Smoker  . Smokeless tobacco: Former Neurosurgeon    Types: Snuff, Chew  . Alcohol use No     Allergies   Penicillins   Review of Systems Review of Systems  Unable to perform ROS: Acuity of condition     Physical Exam Updated Vital  Signs BP (!) 65/47   Pulse (!) 125   Temp 97.5 F (36.4 C) (Axillary)   Resp 26   Ht 5\' 10"  (1.778 m)   Wt 63.5 kg   SpO2 (!) 65%   BMI 20.09 kg/m   Physical Exam CONSTITUTIONAL: Elderly, frail, cachectic HEAD: Normocephalic/atraumatic EYES: EOMI ENMT: Mucous membranes dry NECK: supple no meningeal signs CV: tachycardic LUNGS: tachypneic, lung sounds limited by exam ABDOMEN: abdomen is firm to palpation ZO:XWRUEA catheter in place.  No overlying erythema/edema NEURO: Pt is awake and will try to speak but it is incomprehensible.   EXTREMITIES: no deformities SKIN: warm, color normal PSYCH: unable to assess   ED Treatments / Results  Labs (all labs ordered are listed, but only abnormal results are displayed) Labs Reviewed  COMPREHENSIVE METABOLIC PANEL - Abnormal; Notable for the following:       Result Value   CO2 18 (*)    BUN 22 (*)    Creatinine, Ser 2.23 (*)    Calcium 8.7 (*)    Albumin 2.4 (*)    ALT 9 (*)    GFR calc non Af Amer 23 (*)    GFR calc Af Amer 26 (*)    Anion gap 16 (*)    All other components within normal limits  CBC WITH DIFFERENTIAL/PLATELET - Abnormal; Notable for the following:    WBC 12.9 (*)    RBC 3.45 (*)    Hemoglobin 9.1 (*)    HCT 33.1 (*)    MCHC 27.5 (*)    RDW 16.3 (*)    Neutro Abs 11.1 (*)    All other components within normal limits  I-STAT CG4 LACTIC ACID, ED - Abnormal; Notable for the following:    Lactic Acid, Venous 9.74 (*)    All other components within normal limits  CULTURE, BLOOD (ROUTINE X 2)  CULTURE, BLOOD (ROUTINE X 2)  URINE CULTURE  URINALYSIS, ROUTINE W REFLEX MICROSCOPIC (NOT AT [facility])    EKG  EKG Interpretation  Date/Time:  Friday 2016/01/27 14:44:46 EDT Ventricular Rate:  114 PR Interval:    QRS Duration: 102 QT Interval:  358 QTC Calculation: 493 R Axis:   -65 Text Interpretation:  Sinus tachycardia Left anterior fascicular block LVH with secondary repolarization abnormality  Anterior Q waves, possibly due to LVH ST depression, probably rate related Baseline wander in lead(s) I III aVL Abnormal ekg Confirmed by Bebe Shaggy  MD, Pinchos Topel ([id number]) on Jan 27, 2016 3:05:06 PM       Radiology Dg Chest Port 1 View  Result Date: 01-27-16 CLINICAL DATA:  Weakness, difficulty breathing, bronchitis EXAM: PORTABLE CHEST 1 VIEW COMPARISON:  03/26/2015 FINDINGS: Cardiomediastinal silhouette  is stable. Mild elevation of the right hemidiaphragm again noted. There is patchy atelectasis or infiltrate in lingula and left perihilar. Follow-up to resolution is recommended. IMPRESSION: Patchy atelectasis or infiltrate in lingula and left perihilar. Follow-up to resolution is recommended. Electronically Signed   By: Natasha MeadLiviu  Pop M.D.   On: July 19, 2015 14:33    Procedures Procedures  CRITICAL CARE Performed by: Joya GaskinsWICKLINE,Mehak Roskelley W Total critical care time: 35 minutes Critical care time was exclusive of separately billable procedures and treating other patients. Critical care was necessary to treat or prevent imminent or life-threatening deterioration. Critical care was time spent personally by me on the following activities: development of treatment plan with patient and/or surrogate as well as nursing, discussions with consultants, evaluation of patient's response to treatment, examination of patient, obtaining history from patient or surrogate, ordering and performing treatments and interventions, ordering and review of laboratory studies, ordering and review of radiographic studies, pulse oximetry and re-evaluation of patient's condition. PATIENT WITH SEPSIS REQUIRING IV FLUIDS AND IV ANTIBIOTICS AND ALSO REQUIRED HOSPICE CONSULT, CRITICAL CARE CONSULT   Medications Ordered in ED Medications  levofloxacin (LEVAQUIN) IVPB 750 mg (750 mg Intravenous New Bag/Given 10-14-15 1426)  aztreonam (AZACTAM) 500 mg in dextrose 5 % 50 mL IVPB (not administered)  levofloxacin (LEVAQUIN) IVPB 500 mg (not  administered)  vancomycin (VANCOCIN) IVPB 1000 mg/200 mL premix (not administered)  sodium chloride 0.9 % bolus 1,000 mL (1,000 mLs Intravenous New Bag/Given 10-14-15 1409)    And  sodium chloride 0.9 % bolus 1,000 mL (1,000 mLs Intravenous New Bag/Given 10-14-15 1418)  aztreonam (AZACTAM) 2 g in dextrose 5 % 50 mL IVPB (2 g Intravenous New Bag/Given 10-14-15 1424)  vancomycin (VANCOCIN) IVPB 1000 mg/200 mL premix (1,000 mg Intravenous New Bag/Given 10-14-15 1426)     Initial Impression / Assessment and Plan / ED Course  I have reviewed the triage vital signs and the nursing notes.  Pertinent labs & imaging results that were available during my care of the patient were reviewed by me and considered in my medical decision making (see chart for details).  Clinical Course  2:09 PM I spoke to hospice who reports pt is a DNR and "no hospitalizations" I then spoke to Grand-daughter Mrs. Mayford KnifeWilliams [phone] MPOA She reports "we want to keep him here" so she requests full workup/treatment including hospitalization except if he goes into full arrest AND HE IS NOT TO BE CODED 3:05 PM PT APPEARS TO BE IMPROVED BP IMPROVED D/W CRITICAL CARE REQUEST WE RECHECK LACTATE AFTER IV FLUIDS  Sepsis - Repeat Assessment  Performed at:    1505  Vitals     Blood pressure (!) 90/47, pulse 112, temperature 97.5 F (36.4 C), temperature source Axillary, resp. rate (!) 31, height 5\' 10"  (1.778 m), weight 63.5 kg, SpO2 100 %.  Heart:     Tachycardic  Lungs:    Rhonchi  Capillary Refill:   <2 sec  Peripheral Pulse:   Radial pulse palpable  Skin:     Pale  3:40 PM Signed out to dr Jodi Mourningzavitz Plan is to recheck lactate If improving pt to be admitted to hospitalist If no lactate clearance he will need ICU admit Pt is a DNR but no currently comfort care per family   Final Clinical Impressions(s) / ED Diagnoses   Final diagnoses:  Septic shock (HCC)  CAP (community acquired pneumonia)    New  Prescriptions New Prescriptions   No medications on file     Zadie Rhineonald Aarav Burgett, MD [id number]  [id number]  

## 2016-01-18 NOTE — Discharge Summary (Addendum)
Death Summary  Luke Coverrthur Stidd ZOX:[id number] DOB: 06-17-1914 DOA: 09-09-15  PCP: Evlyn CourierGerald K Hill, MD PCP/Office notified: Not yet, since it is Friday evening.  Admit date: 09-09-15 Date of Death: 09-09-15  Final Diagnoses:  Principal Problem:     Septic shock due to undetermined organism [facility](HCC) Admitted to the stepdown unit/inpatient. Unfortunately, as earlier mentioned, aggressive IV fluid resuscitation and IV antibiotic therapy were not effective to reverse shock. However, the patient briefly felt better and was able to communicate his wishes for care to his granddaughter. After discussing with his granddaughter, they did not want pressors, endotracheal intubation with mechanical ventilation, central or arterial line placement. He was pronounced dead around 2130.  Active Problems:   CAP (community acquired pneumonia) Received supplemental oxygen, IV fluid boluses and IV antibiotics.    Lactic acidosis Received multiple IV fluid boluses and IV antibiotics with partial improvement.    AKI (acute kidney injury) (HCC) Received multiple IV fluid boluses.    Nausea and vomiting   Diarrhea Received aggressive rehydration.     Aortic valve stenosis   CAD S/P percutaneous coronary angioplasty   HTN (hypertension)   HLD (hyperlipidemia)   BPH (benign prostatic hyperplasia) Treatment for these was not started as the patient passed away before he was able to continue therapy for above diagnoses.    History of present illness:  Luke Sanchez is a 82 y.o. male with medical history significant of osteoarthritis, bronchitis, pneumonia, CAD/Stent placement, hypertension, pneumonia, UTI currently on hospice who was brought to the emergency department via EMS due to nausea and vomiting since yesterday.  Per patient's granddaughter and Salome ArntOA,Tashana Spann, the patient as had countless episodes of emesis yesterday. He also had 5 episodes of emesis today, but she does not know much of the story,  since her mother, the patient's daughter was the one staying with him for the last few days. His systolic blood pressure was 65 mmHg on arrival to the hospital.  Hospital Course:   Initial lactic acid was 9.74 mmol/L, WBC 12.9, hemoglobin 9.1 g/dL, his creatinine went up from 1.20 in December to 2.23 today mg/dL. The patient received multiple fluid boluses of normal saline and lactated Ringer without significant success in bringing getting up his blood pressure. Also vancomycin, Levaquin and aztreonam per pharmacy.  The clinical status of the patient was discussed in extensive detail with the patient's granddaughter. She wanted him to remain DNR/DNI, but receiving full medical treatment without any heroic measures orprocedures. ICU was consulted by Dr. Jodi MourningZavitz, they recommended a stepdown admission as no procedures were going to be needed.   Time: 60 minutes  Signed:  Bobette MoDavid Manuel Eddrick Dilone  [facility] 09-09-15, 11:20 PM

## 2016-01-18 NOTE — ED Notes (Signed)
Pt BP still low at this time. Family updated and aware of bed assignment. Explained to pt he will be going upstairs soon. Pt verbalized understanding and that he is still pain free at this time.

## 2016-01-18 NOTE — ED Notes (Signed)
Pt more alert and now responds to questions. Cough noted as congested.

## 2016-01-18 NOTE — ED Notes (Signed)
Attempted in out cath with sterile techniqwue. Unable to pass cath. Dr. Bebe Shaggywickline aware.

## 2016-01-18 NOTE — ED Notes (Signed)
Attempted to call report

## 2016-01-18 NOTE — ED Notes (Signed)
MD made aware of bp decrease. LR bolus infusing. Family aware and understand pt condition made not improve and understand no heroic measures will be take. Pt lethargic, but verbal and states he feels " fair". Pt denies any pain at this time.

## 2016-01-18 NOTE — ED Notes (Signed)
Pt monitor reading asystole. [facility] bedside [facility]. No femoral or carotid pulses palpated. Repeat ekg preformed and Dr. Gardiner RhymeZavits and Dr. Robb Matarrtiz aware and at bedside. Chaplain paged by [employer] operational officersecretary for 2 family members at bedside.

## 2016-01-18 DEATH — deceased
